# Patient Record
Sex: Male | Born: 1975 | Race: White | Hispanic: No | Marital: Single | Smoking: Current every day smoker
Health system: Southern US, Community
[De-identification: ages and names within clinical notes are randomized; demographics above are authoritative.]

## PROBLEM LIST (undated history)

## (undated) DIAGNOSIS — I10 Essential (primary) hypertension: Secondary | ICD-10-CM

## (undated) DIAGNOSIS — J45909 Unspecified asthma, uncomplicated: Secondary | ICD-10-CM

## (undated) DIAGNOSIS — I82409 Acute embolism and thrombosis of unspecified deep veins of unspecified lower extremity: Secondary | ICD-10-CM

## (undated) HISTORY — PX: NO PAST SURGERIES: SHX2092

---

## 2005-11-25 ENCOUNTER — Inpatient Hospital Stay: Payer: Self-pay | Admitting: Internal Medicine

## 2005-12-07 ENCOUNTER — Emergency Department: Payer: Self-pay | Admitting: Emergency Medicine

## 2005-12-08 ENCOUNTER — Emergency Department: Payer: Self-pay | Admitting: General Practice

## 2011-04-03 ENCOUNTER — Ambulatory Visit: Payer: Self-pay | Admitting: Family Medicine

## 2013-09-12 ENCOUNTER — Emergency Department: Payer: Self-pay | Admitting: Emergency Medicine

## 2014-03-05 ENCOUNTER — Emergency Department: Payer: Self-pay | Admitting: Emergency Medicine

## 2014-05-16 ENCOUNTER — Ambulatory Visit: Payer: Self-pay | Admitting: Physician Assistant

## 2014-07-28 ENCOUNTER — Ambulatory Visit: Payer: Self-pay | Admitting: Unknown Physician Specialty

## 2015-05-01 ENCOUNTER — Ambulatory Visit: Payer: Medicaid Other

## 2015-05-01 ENCOUNTER — Ambulatory Visit
Admission: EM | Admit: 2015-05-01 | Discharge: 2015-05-01 | Disposition: A | Discharge status: Home / Self Care | Payer: Medicaid Other | Attending: Family Medicine | Admitting: Family Medicine

## 2015-05-01 DIAGNOSIS — S9031XA Contusion of right foot, initial encounter: Secondary | ICD-10-CM

## 2015-05-01 DIAGNOSIS — T148XXA Other injury of unspecified body region, initial encounter: Secondary | ICD-10-CM

## 2015-05-01 DIAGNOSIS — T148 Other injury of unspecified body region: Secondary | ICD-10-CM

## 2015-05-01 DIAGNOSIS — W19XXXA Unspecified fall, initial encounter: Secondary | ICD-10-CM

## 2015-05-01 DIAGNOSIS — M25562 Pain in left knee: Secondary | ICD-10-CM

## 2015-05-01 HISTORY — DX: Essential (primary) hypertension: I10

## 2015-05-01 HISTORY — DX: Acute embolism and thrombosis of unspecified deep veins of unspecified lower extremity: I82.409

## 2015-05-01 HISTORY — DX: Unspecified asthma, uncomplicated: J45.909

## 2015-05-01 LAB — URINALYSIS COMPLETE WITH MICROSCOPIC (ARMC ONLY)
Bacteria, UA: NONE SEEN — AB
GLUCOSE, UA: NEGATIVE mg/dL
HGB URINE DIPSTICK: NEGATIVE
Ketones, ur: NEGATIVE mg/dL
LEUKOCYTES UA: NEGATIVE
NITRITE: NEGATIVE
PH: 5.5 (ref 5.0–8.0)
Protein, ur: NEGATIVE mg/dL
RBC / HPF: NONE SEEN RBC/hpf (ref <3)

## 2015-05-01 MED ORDER — OXYCODONE-ACETAMINOPHEN 5-325 MG PO TABS: 1.0000 | ORAL_TABLET | Freq: Three times a day (TID) | ORAL | Status: DC | PRN

## 2015-05-01 MED ORDER — IBUPROFEN 800 MG PO TABS: 800.0000 mg | ORAL_TABLET | Freq: Three times a day (TID) | ORAL | Status: DC | PRN

## 2015-05-01 NOTE — Discharge Instructions (Signed)
Take medication as prescribed. Apply ice and elevate. Rest. Use knee immobilizer and use crutches as long as pain continues.   Follow up with your primary care physician this week. Follow up with your orthopedic Dr Gavin Potters next week as needed for continued knee pain.   Return to Urgent care or go to ER for headache, dizziness, confusion, abdominal pain, chest pain, shortness of breath, weakness, neck pain, new or worsening concerns.   Contusion A contusion is a deep bruise. Contusions are the result of an injury that caused bleeding under the skin. The contusion may turn blue, purple, or yellow. Minor injuries will give you a painless contusion, but more severe contusions may stay painful and swollen for a few weeks.  CAUSES  A contusion is usually caused by a blow, trauma, or direct force to an area of the body. SYMPTOMS   Swelling and redness of the injured area.  Bruising of the injured area.  Tenderness and soreness of the injured area.  Pain. DIAGNOSIS  The diagnosis can be made by taking a history and physical exam. An X-ray, CT scan, or MRI may be needed to determine if there were any associated injuries, such as fractures. TREATMENT  Specific treatment will depend on what area of the body was injured. In general, the best treatment for a contusion is resting, icing, elevating, and applying cold compresses to the injured area. Over-the-counter medicines may also be recommended for pain control. Ask your caregiver what the best treatment is for your contusion. HOME CARE INSTRUCTIONS   Put ice on the injured area.  Put ice in a plastic bag.  Place a towel between your skin and the bag.  Leave the ice on for 15-20 minutes, 3-4 times a day, or as directed by your health care provider.  Only take over-the-counter or prescription medicines for pain, discomfort, or fever as directed by your caregiver. Your caregiver may recommend avoiding anti-inflammatory medicines (aspirin,  ibuprofen, and naproxen) for 48 hours because these medicines may increase bruising.  Rest the injured area.  If possible, elevate the injured area to reduce swelling. SEEK IMMEDIATE MEDICAL CARE IF:   You have increased bruising or swelling.  You have pain that is getting worse.  Your swelling or pain is not relieved with medicines. MAKE SURE YOU:   Understand these instructions.  Will watch your condition.  Will get help right away if you are not doing well or get worse. Document Released: 05/21/2005 Document Revised: 08/16/2013 Document Reviewed: 06/16/2011 [facility] Patient Information 2015 [place], [place]. This information is not intended to replace advice given to you by your health care provider. Make sure you discuss any questions you have with your health care provider.  Foot Contusion  A foot contusion is a deep bruise to the foot. Contusions happen when an injury causes bleeding under the skin. Signs of bruising include pain, puffiness (swelling), and discolored skin. The contusion may turn blue, purple, or yellow. HOME CARE  Put ice on the injured area.  Put ice in a plastic bag.  Place a towel between your skin and the bag.  Leave the ice on for 15-20 minutes, 03-04 times a day.  Only take medicines as told by your doctor.  Use an elastic wrap only as told. You may remove the wrap for sleeping, showering, and bathing. Take the wrap off if you lose feeling (numb) in your toes, or they turn blue or cold. Put the wrap on more loosely.  Keep the foot raised (  elevated) with pillows.  If your foot hurts, avoid standing or walking.  When your doctor says it is okay to use your foot, start using it slowly. If you have pain, lessen how much you use your foot.  See your doctor as told. GET HELP RIGHT AWAY IF:   You have more redness, puffiness, or pain in your foot.  Your puffiness or pain does not get better with medicine.  You lose feeling in your foot, or you  cannot move your toes.  Your foot turns cold or blue.  You have pain when you move your toes.  Your foot feels warm.  Your contusion does not get better in 2 days. MAKE SURE YOU:   Understand these instructions.  Will watch this condition.  Will get help right away if you or your child is not doing well or gets worse. Document Released: 05/20/2008 Document Revised: 02/10/2012 Document Reviewed: 07/15/2011 [facility] Patient Information 2015 [place], [place]. This information is not intended to replace advice given to you by your health care provider. Make sure you discuss any questions you have with your health care provider.  Knee Pain The knee is the complex joint between your thigh and your lower leg. It is made up of bones, tendons, ligaments, and cartilage. The bones that make up the knee are:  The femur in the thigh.  The tibia and fibula in the lower leg.  The patella or kneecap riding in the groove on the lower femur. CAUSES  Knee pain is a common complaint with many causes. A few of these causes are:  Injury, such as:  A ruptured ligament or tendon injury.  Torn cartilage.  Medical conditions, such as:  Gout  Arthritis  Infections  Overuse, over training, or overdoing a physical activity. Knee pain can be minor or severe. Knee pain can accompany debilitating injury. Minor knee problems often respond well to self-care measures or get well on their own. More serious injuries may need medical intervention or even surgery. SYMPTOMS The knee is complex. Symptoms of knee problems can vary widely. Some of the problems are:  Pain with movement and weight bearing.  Swelling and tenderness.  Buckling of the knee.  Inability to straighten or extend your knee.  Your knee locks and you cannot straighten it.  Warmth and redness with pain and fever.  Deformity or dislocation of the kneecap. DIAGNOSIS  Determining what is wrong may be very straight forward such as  when there is an injury. It can also be challenging because of the complexity of the knee. Tests to make a diagnosis may include:  Your caregiver taking a history and doing a physical exam.  Routine X-rays can be used to rule out other problems. X-rays will not reveal a cartilage tear. Some injuries of the knee can be diagnosed by:  Arthroscopy a surgical technique by which a small video camera is inserted through tiny incisions on the sides of the knee. This procedure is used to examine and repair internal knee joint problems. Tiny instruments can be used during arthroscopy to repair the torn knee cartilage (meniscus).  Arthrography is a radiology technique. A contrast liquid is directly injected into the knee joint. Internal structures of the knee joint then become visible on X-ray film.  An MRI scan is a non X-ray radiology procedure in which magnetic fields and a computer produce two- or three-dimensional images of the inside of the knee. Cartilage tears are often visible using an MRI scanner. MRI scans  have largely replaced arthrography in diagnosing cartilage tears of the knee.  Blood work.  Examination of the fluid that helps to lubricate the knee joint (synovial fluid). This is done by taking a sample out using a needle and a syringe. TREATMENT The treatment of knee problems depends on the cause. Some of these treatments are:  Depending on the injury, proper casting, splinting, surgery, or physical therapy care will be needed.  Give yourself adequate recovery time. Do not overuse your joints. If you begin to get sore during workout routines, back off. Slow down or do fewer repetitions.  For repetitive activities such as cycling or running, maintain your strength and nutrition.  Alternate muscle groups. For example, if you are a weight lifter, work the upper body on one day and the lower body the next.  Either tight or weak muscles do not give the proper support for your knee. Tight  or weak muscles do not absorb the stress placed on the knee joint. Keep the muscles surrounding the knee strong.  Take care of mechanical problems.  If you have flat feet, orthotics or special shoes may help. See your caregiver if you need help.  Arch supports, sometimes with wedges on the inner or outer aspect of the heel, can help. These can shift pressure away from the side of the knee most bothered by osteoarthritis.  A brace called an "unloader" brace also may be used to help ease the pressure on the most arthritic side of the knee.  If your caregiver has prescribed crutches, braces, wraps or ice, use as directed. The acronym for this is PRICE. This means protection, rest, ice, compression, and elevation.  Nonsteroidal anti-inflammatory drugs (NSAIDs), can help relieve pain. But if taken immediately after an injury, they may actually increase swelling. Take NSAIDs with food in your stomach. Stop them if you develop stomach problems. Do not take these if you have a history of ulcers, stomach pain, or bleeding from the bowel. Do not take without your caregiver's approval if you have problems with fluid retention, heart failure, or kidney problems.  For ongoing knee problems, physical therapy may be helpful.  Glucosamine and chondroitin are over-the-counter dietary supplements. Both may help relieve the pain of osteoarthritis in the knee. These medicines are different from the usual anti-inflammatory drugs. Glucosamine may decrease the rate of cartilage destruction.  Injections of a corticosteroid drug into your knee joint may help reduce the symptoms of an arthritis flare-up. They may provide pain relief that lasts a few months. You may have to wait a few months between injections. The injections do have a small increased risk of infection, water retention, and elevated blood sugar levels.  Hyaluronic acid injected into damaged joints may ease pain and provide lubrication. These injections may  work by reducing inflammation. A series of shots may give relief for as long as 6 months.  Topical painkillers. Applying certain ointments to your skin may help relieve the pain and stiffness of osteoarthritis. Ask your pharmacist for suggestions. Many over the-counter products are approved for temporary relief of arthritis pain.  In some countries, doctors often prescribe topical NSAIDs for relief of chronic conditions such as arthritis and tendinitis. A review of treatment with NSAID creams found that they worked as well as oral medications but without the serious side effects. PREVENTION  Maintain a healthy weight. Extra pounds put more strain on your joints.  Get strong, stay limber. Weak muscles are a common cause of knee injuries. Stretching is  important. Include flexibility exercises in your workouts.  Be smart about exercise. If you have osteoarthritis, chronic knee pain or recurring injuries, you may need to change the way you exercise. This does not mean you have to stop being active. If your knees ache after jogging or playing basketball, consider switching to swimming, water aerobics, or other low-impact activities, at least for a few days a week. Sometimes limiting high-impact activities will provide relief.  Make sure your shoes fit well. Choose footwear that is right for your sport.  Protect your knees. Use the proper gear for knee-sensitive activities. Use kneepads when playing volleyball or laying carpet. Buckle your seat belt every time you drive. Most shattered kneecaps occur in car accidents.  Rest when you are tired. SEEK MEDICAL CARE IF:  You have knee pain that is continual and does not seem to be getting better.  SEEK IMMEDIATE MEDICAL CARE IF:  Your knee joint feels hot to the touch and you have a high fever. MAKE SURE YOU:   Understand these instructions.  Will watch your condition.  Will get help right away if you are not doing well or get worse. Document  Released: 06/08/2007 Document Revised: 11/03/2011 Document Reviewed: 06/08/2007 [facility] Patient Information 2015 [place], [place]. This information is not intended to replace advice given to you by your health care provider. Make sure you discuss any questions you have with your health care provider.

## 2015-05-01 NOTE — ED Provider Notes (Signed)
[facility] Emergency Department Provider Note  ____________________________________________  Time seen: Approximately 1:43 PM  I have reviewed the triage vital signs and the nursing notes.   HISTORY  Chief Complaint Fall   HPI Logan Howard is a 39 y.o. male presents for complaints of bilateral lower leg pain. 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 States he was going up to the second floor by ladder and states just as he got to second floor his ladder shifted and moved. States he jumped off the ladder and held to the second floor landing with his arms. States when he jumped and held to the second floor he landed with chest and abdomen against landing and arms on top of landing. States when he landed this way it caused abrasions to abdomen, side and arms. States abrasions happened as he was sliding on the wood with arms and chest trying to hold on. States he was able to briefly hold on, but could not continue to hold and fell.   States when he fell he landed on his feet and then landed on his left knee. States he was wearing a hard hat helmet. States he did NOT hit head. Denies head injury or loss of consciousness. Denies neck or back injury. Denies neck or back pain. States did NOT injury or hit abdomen, chest or back from fall. States only hit chest and abdomen on landing while holding onto wooden landing. States height of top of landing was approximately 11 feet, but states he was able to lower self and control his fall some when he fell. States when he fell the distance from his fall was max approximately 8 feet (states distance from feet to ground was only a few feet).   Patient states injury was witnessed by coworkers, who also report he landed on feet and report no head injury or LOC. Reports he and coworkers agreed to fall and landing, and states fell to feet and then to both  knees. States he never fell all the way to the floor but states he was on his hands and knees. Denies hitting torso, head, neck or back on ground or other objects during fall.  States he has remained active throughout the day today including addressing his girlfriends dead car battery. States he has eaten and drank multiple times today. States urinating and moving bowels well since fall. Denies headache, dizziness, vision changes, weakness, neck or back pain, abdominal pain, rib pain, upper leg pain, chest pain, shortness of breath. States no pain above knees at rest or with movement. Denies blood in urine or stool.    Past Medical History  Diagnosis Date  . Hypertension   . Asthma   . DVT (deep venous thrombosis)    Primary osteoarthritis of right knee 11/16/2014  Complex tear of medial meniscus as current injury, left, subsequent encounter    PCP: Welton Flakes   There are no active problems to display for this patient.   History reviewed. No pertinent past surgical history.  Current Outpatient Rx  Name  Route  Sig  Dispense  Refill  . ibuprofen (ADVIL,MOTRIN) 800 MG tablet   Oral   Take 1 tablet (800 mg total) by mouth every 8 (eight) hours as needed for mild pain or moderate pain.   15 tablet   0   . oxyCODONE-acetaminophen (ROXICET) 5-325 MG per tablet   Oral   Take  1 tablet by mouth every 8 (eight) hours as needed for moderate pain or severe pain (Do not drive or operate heavy machinery while taking as can cause drowsiness.).   12 tablet   0     Allergies Review of patient's allergies indicates no known allergies.  History reviewed. No pertinent family history.  Social History Social History  Substance Use Topics  . Smoking status: Current Every Day Smoker -- 2.00 packs/day    Types: Cigarettes  . Smokeless tobacco: None  . Alcohol Use: Yes     Comment: 8 beers per day    Review of Systems Constitutional: No fever/chills Eyes: No visual changes. ENT: No sore  throat. Cardiovascular: Denies chest pain. Respiratory: Denies shortness of breath. Gastrointestinal: No abdominal pain.  No nausea, no vomiting.  No diarrhea.  No constipation. Genitourinary: Negative for dysuria. Musculoskeletal: Negative for neck or back pain.psoitive for right and left leg pain.  Skin: Negative for rash. Neurological: Negative for headaches, focal weakness or numbness.Denies for LOC.   10-point ROS otherwise negative.  ____________________________________________   PHYSICAL EXAM:  VITAL SIGNS: ED Triage Vitals  Enc Vitals Group     BP 05/01/15 1313 173/90 mmHg     Pulse Rate 05/01/15 1313 88     Resp 05/01/15 1313 18     Temp 05/01/15 1313 98.2 F (36.8 C)     Temp Source 05/01/15 1313 Tympanic     SpO2 05/01/15 1313 99 %     Weight 05/01/15 1313 320 lb (145.151 kg)     Height 05/01/15 1313  (1.93 m)     Head Cir --      Peak Flow --      Pain Score 05/01/15 1315 9     Pain Loc --      Pain Edu? --      Excl. in GC? --    Constitutional: Alert and oriented. Well appearing and in no acute distress. Eyes: Conjunctivae are normal. PERRL. EOMI.   Head: Atraumatic. No sinus TTP.Nontender. No tenderness to palpation.   Ears: no erythema, normal TMs bilaterally.   Nose: No congestion/rhinnorhea.  Mouth/Throat: Mucous membranes are moist.  Oropharynx non-erythematous. Neck: No stridor.  No cervical spine tenderness to palpation. Hematological/Lymphatic/Immunilogical: No cervical lymphadenopathy. Cardiovascular: Normal rate, regular rhythm. Grossly normal heart sounds.  Good peripheral circulation. Respiratory: Normal respiratory effort.  No retractions. Lungs CTAB. Gastrointestinal: Soft and nontender. No distention. Normal Bowel sounds.  No abdominal bruits. No CVA tenderness. Mid abdomen with superficial abrasions, no ecchymosis, Non tender. Musculoskeletal:  Bilateral pedal pulses equal and easily palpated. No cervical, thoracic or lumbar TTP.  Ribs  nontender. Right posterior upper back with superficial abrasion, no ecchymosis, nontender. Left lateral elbow with superficial abrasions, nontender, no swelling, full ROM.  Except: Right foot with ecchymosis along great toe with mild to mod TTP great toe and distal lateral foot, mild ecchymosis to anterior lower right tibia but nontender. Full ROM to right lower extremity. Right knee nontender. Left anterior proximal tibia/fibula area with mild to mod TTP, no swelling or ecchymosis, left dorsal foot mild TTP but no ecchymosis or swelling. Full ROM to left lower extremity. Steady gait. Bilateral pedal pulses equal and easily palpated. No calf tenderness bilaterally. Bilateral extremities nontender above knees.  Neurologic:  Normal speech and language. No gross focal neurologic deficits are appreciated. No gait instability. No ataxia, normal finger to nose. Negative Romberg. GCS 15.  5/5 strength to bilateral upper and lower extremities. CN 2-12  grossly intact.  Skin:  Skin is warm, dry and intact. No rash noted. Abrasions as above.  Psychiatric: Mood and affect are normal. Speech and behavior are normal. ____________________________________________   LABS (all labs ordered are listed, but only abnormal results are displayed)  Labs Reviewed  URINALYSIS COMPLETEWITH MICROSCOPIC ([facility] ONLY) - Abnormal; Notable for the following:    Bilirubin Urine 1+ (*)    Specific Gravity, Urine >1.030 (*)    Bacteria, UA NONE SEEN (*)    Squamous Epithelial / LPF 0-5 (*)    All other components within normal limits    RADIOLOGY   EXAM: LEFT TIBIA AND FIBULA - 2 VIEW  COMPARISON: None.  FINDINGS: No fracture is identified. Moderate appearing left knee joint effusion is seen. Small plantar calcaneal spurs are noted.  IMPRESSION: Negative for fracture.  Moderate left knee effusion.   Electronically Signed By: Drusilla Kanner M.D. On: 05/01/2015 14:34          DG Tibia/Fibula Right  (Final result) Result time: 05/01/15 14:34:19   Final result by Rad Results In Interface (05/01/15 14:34:19)   Narrative:   CLINICAL DATA: Fall from ladder with right foot and leg pain. Initial encounter.  EXAM: RIGHT TIBIA AND FIBULA - 2 VIEW  COMPARISON: None.  FINDINGS: Nonspecific subcutaneous reticulation about the lower right leg. No evidence for fracture or dislocation. No radiopaque foreign body.  IMPRESSION: Negative for fracture.   Electronically Signed By: Marnee Spring M.D. On: 05/01/2015 14:34          DG Foot Complete Left (Final result) Result time: 05/01/15 14:35:03   Final result by Rad Results In Interface (05/01/15 14:35:03)   Narrative:   CLINICAL DATA: Left foot pain after fall.  EXAM: LEFT FOOT - COMPLETE 3+ VIEW  COMPARISON: None.  FINDINGS: There is mild soft tissue swelling in the dorsal left midfoot. No fracture, dislocation or suspicious focal osseous lesion is seen in the left foot. The Lisfranc joint appears intact. Tiny Achilles and small to moderate plantar left calcaneal spurs. Joint spaces appear normal, with no appreciable degenerative or erosive arthropathy.  IMPRESSION: 1. No fracture or malalignment in the left foot. 2. Tiny Achilles and small to moderate plantar left calcaneal spurs.   Electronically Signed By: Delbert Phenix M.D. On: 05/01/2015 14:35          DG Foot Complete Right (Final result) Result time: 05/01/15 14:36:49   Final result by Rad Results In Interface (05/01/15 14:36:49)   Narrative:   CLINICAL DATA: Acute right foot pain after fall off ladder. Initial encounter.  EXAM: RIGHT FOOT COMPLETE - 3+ VIEW  COMPARISON: None.  FINDINGS: There is no evidence of fracture or dislocation. Severe degenerative changes seen involving the first metatarsophalangeal joint. Soft tissues are unremarkable.  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 No acute abnormality seen in the right foot.   Electronically Signed By: Lupita Raider, M.D. On: 05/01/2015 14:36   I, Renford Dills, personally viewed and evaluated these images (plain radiographs) as part of my medical decision making.   ____________________________________________   PROCEDURES  Procedure(s) performed:  Crutches given and left knee immobilizer placed by RN. Neurovascular intact post application.  _________________________________________   INITIAL IMPRESSION / ASSESSMENT AND PLAN / ED COURSE  Pertinent labs & imaging results that were available during my care of the patient were reviewed by me and considered in my medical decision making (see chart for details).  Discussed patient and plan of care  in detail with Dr Thurmond Butts who agrees with plan and plan of care.   Very well appearing patient. Ambulatory in room. Changes positions from lying to standing quickly without distress or difficulty. Reports felt this morning at approximately 0700. Reports spoke with multiple coworkers who witnessed event who also confirm with mechanism and fall. States he and coworkers report he fell to his feet and then to his left knee. States did not fall to side, did not hit abdomen or chest during fall, did not hit head or have LOC. Reports only pain is pain to bilateral lower legs and feet. Denies headache, dizziness, neck, back or abdominal pain. Denies chest pain, shortness of breath.Abdomen soft and nontender.  States no pain above bilateral knees. Remains ambulatory.   Left tibia and fibula xray negative for fracture, moderate left knee effusion, Left foot xray negative for fracture. Right foot xray negative for acute bony abnormality, sever degenerative joint disease of the first metatarsophalangeal joint. Right tibia fibula xray  Negative for fracture. Steady gait in room. No acute distress.   Rest. Ice. Crutches and left knee immobilizer. Left knee  with moderate left knee effusion and with reported history of left meniscus tear, discussed rest and follow up with his orthopedist Dr Gavin Potters. Discussed very strict follow up and return parameters with patient. Discussed the patient to follow up with his primary care physician this week. Discussed with patient to return to urgent care or proceed directly to the ER for worsening concerns including headache, dizziness, weakness, chest pain, shortness of breath, abdominal pain, increased pain or other concerns. Patient verbalized understanding and agreed to plan.  ____________________________________________   FINAL CLINICAL IMPRESSION(S) / ED DIAGNOSES  Final diagnoses:  Fall, initial encounter  Foot contusion, right, initial encounter  Left knee pain  Contusion       Renford Dills, NP 05/01/15 1943    Renford Dills, NP 05/01/15 2142

## 2015-05-01 NOTE — ED Notes (Signed)
States was 11-12 feet up a ladder this morning around 7am and the ladder fell. Hung onto ladder and "folded" when fell. Denies LOC. +brusing left upper abdomen, left elbow, left knee/leg. Right posterior thoracic bruising and states right great and 4th toes are bruised.

## 2016-06-02 ENCOUNTER — Emergency Department: Payer: Medicaid Other

## 2016-06-02 ENCOUNTER — Encounter: Payer: Self-pay | Admitting: Emergency Medicine

## 2016-06-02 ENCOUNTER — Emergency Department
Admission: EM | Admit: 2016-06-02 | Discharge: 2016-06-02 | Disposition: A | Discharge status: Home / Self Care | Payer: Medicaid Other | Attending: Emergency Medicine | Admitting: Emergency Medicine

## 2016-06-02 DIAGNOSIS — F17210 Nicotine dependence, cigarettes, uncomplicated: Secondary | ICD-10-CM | POA: Diagnosis not present

## 2016-06-02 DIAGNOSIS — J209 Acute bronchitis, unspecified: Secondary | ICD-10-CM | POA: Diagnosis not present

## 2016-06-02 DIAGNOSIS — I10 Essential (primary) hypertension: Secondary | ICD-10-CM | POA: Diagnosis not present

## 2016-06-02 DIAGNOSIS — R05 Cough: Secondary | ICD-10-CM | POA: Diagnosis present

## 2016-06-02 DIAGNOSIS — J45909 Unspecified asthma, uncomplicated: Secondary | ICD-10-CM | POA: Diagnosis not present

## 2016-06-02 MED ORDER — PROMETHAZINE-DM 6.25-15 MG/5ML PO SYRP: 5.0000 mL | ORAL_SOLUTION | Freq: Four times a day (QID) | ORAL | 0 refills | Status: DC | PRN

## 2016-06-02 MED ORDER — ALBUTEROL SULFATE HFA 108 (90 BASE) MCG/ACT IN AERS: 1.0000 | INHALATION_SPRAY | Freq: Four times a day (QID) | RESPIRATORY_TRACT | 0 refills | Status: DC | PRN

## 2016-06-02 MED ORDER — ALBUTEROL SULFATE (2.5 MG/3ML) 0.083% IN NEBU
2.5000 mg | INHALATION_SOLUTION | Freq: Once | RESPIRATORY_TRACT | Status: AC
  Administered 2016-06-02: 2.5 mg via RESPIRATORY_TRACT
  Filled 2016-06-02: qty 3

## 2016-06-02 NOTE — ED Notes (Signed)
See triage note  Developed sinus pressure and congestion last Friday  Also dry cough

## 2016-06-02 NOTE — ED Provider Notes (Signed)
[facility] Emergency Department Provider Note  ____________________________________________  Time seen: Approximately 9:26 AM  I have reviewed the triage vital signs and the nursing notes.   HISTORY  Chief Complaint Facial Pain and Cough    HPI Logan Howard is a 40 y.o. male , NAD, presents to the emergency department with 3 day history of sinus pressure, chest congestion and wheezing. Patient states illness began on Friday with sinus pressure and nasal congestion. Has worsened to include chest congestion, cough and wheezing. Has a history of asthma as a child but has not used any inhalers as an adult. Has had no shortness of breath or chest pain. Denies any abdominal pain, nausea or vomiting. Has not noted any rashes.Not had any sore throat, ear pain, headaches. Denies fever, chills, body aches. Has been taking multiple over-the-counter cough and cold remedies without any resolution of symptoms. States she's had a history of bronchitis and this feels similar. Also notes he has a nebulizer at home but has not used the medications as they expired in 2016.   Past Medical History:  Diagnosis Date  . Asthma   . DVT (deep venous thrombosis) (HCC)   . Hypertension     There are no active problems to display for this patient.   History reviewed. No pertinent surgical history.  Prior to Admission medications   Medication Sig Start Date End Date Taking? Authorizing Provider  albuterol (PROVENTIL HFA;VENTOLIN HFA) 108 (90 Base) MCG/ACT inhaler Inhale 1-2 puffs into the lungs every 6 (six) hours as needed for wheezing or shortness of breath. 06/02/16   Kaeleb Emond L Messi Twedt, PA-C  promethazine-dextromethorphan (PROMETHAZINE-DM) 6.25-15 MG/5ML syrup Take 5 mLs by mouth 4 (four) times daily as needed for cough. 06/02/16   Preesha Benjamin L Phinehas Grounds, PA-C    Allergies Review of patient's allergies indicates no known allergies.  No family history on file.  Social History Social  History  Substance Use Topics  . Smoking status: Current Every Day Smoker    Packs/day: 2.00    Types: Cigarettes  . Smokeless tobacco: Never Used  . Alcohol use Yes     Comment: 8 beers per day     Review of Systems  Constitutional: No fever/chills ENT: Positive nasal congestion, sinus pressure. No ear pain, sore throat. Cardiovascular: No chest pain. Respiratory: Positive cough, chest congestion, wheezing. No shortness of breath.  Gastrointestinal: No abdominal pain.  No nausea, vomiting.   Musculoskeletal: Negative for General myalgias.  Skin: Negative for rash. Neurological: Negative for headaches. 10-point ROS otherwise negative.  ____________________________________________   PHYSICAL EXAM:  VITAL SIGNS: ED Triage Vitals  Enc Vitals Group     BP 06/02/16 0823 (!) 179/113     Pulse Rate 06/02/16 0823 72     Resp 06/02/16 0823 18     Temp 06/02/16 0823 98.2 F (36.8 C)     Temp Source 06/02/16 0823 Oral     SpO2 06/02/16 0823 98 %     Weight 06/02/16 0820 (!) 320 lb (145.2 kg)     Height --      Head Circumference --      Peak Flow --      Pain Score --      Pain Loc --      Pain Edu? --      Excl. in GC? --      Constitutional: Alert and oriented. Well appearing and in no acute distress. Eyes: Conjunctivae are normal Without icterus or injection Head: Atraumatic.  ENT:      Ears: TMs visualized bilaterally without erythema, effusion, bulge, perforation.      Nose: No congestion/rhinnorhea.      Mouth/Throat: Mucous membranes are moist. Bilateral tonsils with severe swelling but no erythema or exudate. Airway is patent but mildly obstructed due to tonsillar swelling. No pharyngeal erythema or swelling.  Uvula is midline. Neck: No stridor. Supple with full range of motion. Trachea midline. Hematological/Lymphatic/Immunilogical: No cervical lymphadenopathy. Cardiovascular: Normal rate, regular rhythm. Normal S1 and S2.  Good peripheral  circulation. Respiratory: Normal respiratory effort without tachypnea or retractions. Moderate diffuse wheeze throughout bilateral lungs without rhonchi or rales.  Neurologic:  Normal speech and language. No gross focal neurologic deficits are appreciated.  Skin:  Skin is warm, dry and intact. No rash noted. Psychiatric: Mood and affect are normal. Speech and behavior are normal. Patient exhibits appropriate insight and judgement.   ____________________________________________   LABS  None ____________________________________________  EKG  None ____________________________________________  RADIOLOGY I, Hope Pigeon, personally viewed and evaluated these images (plain radiographs) as part of my medical decision making, as well as reviewing the written report by the radiologist.  Dg Chest 2 View  Result Date: 06/02/2016 CLINICAL DATA:  Sinus pressure and congestion since Friday. Dry cough. EXAM: CHEST  2 VIEW COMPARISON:  09/12/2013 FINDINGS: Mild peribronchial thickening. Heart and mediastinal contours are within normal limits. No focal opacities or effusions. No acute bony abnormality. IMPRESSION: Mild bronchitic changes. Electronically Signed   By: Charlett Nose M.D.   On: 06/02/2016 10:01    ____________________________________________    PROCEDURES  Procedure(s) performed: None   Procedures   Medications  albuterol (PROVENTIL) (2.5 MG/3ML) 0.083% nebulizer solution 2.5 mg (2.5 mg Nebulization Given 06/02/16 0953)   Respiratory exam after giving albuterol nebulized solution notes significant decrease in wheezes to trace and patient continues to have no rhonchi or rales.Patient verbalizes improvement of breathing.  ____________________________________________   INITIAL IMPRESSION / ASSESSMENT AND PLAN / ED COURSE  Pertinent labs & imaging results that were available during my care of the patient were reviewed by me and considered in my medical decision making (see  chart for details).  Clinical Course  Comment By Time  Patient notes that he previous had a primary care provider at [facility] but has not been there in over a year. Discussed with the patient that his tonsils were abnormally large and may be causing snoring and contributing to sleep apnea which when turning cause blood pressure to be elevated. Patient states he will attempt to gain an appointment with [facility] for follow-up but if he is unable to really understands he may call [facility] to schedule an appointment for follow-up. Hope Pigeon, PA-C 10/09 1025    Patient's diagnosis is consistent with acute bronchitis. Patient will be discharged home with prescriptions for albuterol inhaler and promethazine-DM cough syrup to take as directed. Patient states he had a primary care at [facility] but has not seen them in over a year. Discussed that he needs evaluation for sleep apnea due to enlarged tonsils and his report that he snores most evenings. If patient is unable to see his previous primary care provider in the medical he may follow up with [facility] for current illness as well as to establish care. Patient is given ED precautions to return to the ED for any worsening or new symptoms.    ____________________________________________  FINAL CLINICAL IMPRESSION(S) / ED DIAGNOSES  Final diagnoses:  Acute bronchitis, unspecified organism      NEW MEDICATIONS STARTED DURING THIS VISIT:  Discharge Medication List as of 06/02/2016 10:20 AM    START taking these medications   Details  albuterol (PROVENTIL HFA;VENTOLIN HFA) 108 (90 Base) MCG/ACT inhaler Inhale 1-2 puffs into the lungs every 6 (six) hours as needed for wheezing or shortness of breath., Starting Mon 06/02/2016, Print    promethazine-dextromethorphan (PROMETHAZINE-DM) 6.25-15 MG/5ML syrup Take 5 mLs by mouth 4 (four) times daily as needed for cough., Starting Mon 06/02/2016, Print              Ernestene Kiel Halsey, PA-C 06/02/16 1111    Jeanmarie Plant, MD 06/02/16 [phone]

## 2016-06-02 NOTE — ED Triage Notes (Signed)
Pt c/o cough and sinus congestion

## 2016-06-02 NOTE — Discharge Instructions (Signed)
Bronchitis is usually a viral illness and antibiotics are not warranted for viruses.   If you are not improving over the next 3-5 days, please see your primary care provider or [facility]   Advise you see your primary care physician to be evaluated for sleep apnea and may need to see ENT to evaluate large tonsils.

## 2018-01-07 ENCOUNTER — Ambulatory Visit
Admission: EM | Admit: 2018-01-07 | Discharge: 2018-01-07 | Disposition: A | Discharge status: Home / Self Care | Payer: Self-pay | Attending: Family Medicine | Admitting: Family Medicine

## 2018-01-07 DIAGNOSIS — J0100 Acute maxillary sinusitis, unspecified: Secondary | ICD-10-CM

## 2018-01-07 MED ORDER — AMOXICILLIN-POT CLAVULANATE 875-125 MG PO TABS: 1.0000 | ORAL_TABLET | Freq: Two times a day (BID) | ORAL | 0 refills | Status: DC

## 2018-01-07 NOTE — ED Provider Notes (Signed)
[facility]    CSN: [id number] Arrival date & time: 01/07/18  1423  History   Chief Complaint Chief Complaint  Patient presents with  . Facial Pain   HPI  42 year old male presents with concerns for sinusitis.  Patient states that he is been sick for the past week.  He has had severe sinus pressure and pain, particularly in the right maxillary region.  Associated congestion.  He reports discolored nasal discharge.  He states it is foul-smelling.  No fever.  He is taken Advil and Advil sinus without improvement.  No known exacerbating factors.  No other associated symptoms.  No other complaints.  Social History Social History   Tobacco Use  . Smoking status: Current Every Day Smoker    Packs/day: 2.00    Types: Cigarettes  . Smokeless tobacco: Never Used  Substance Use Topics  . Alcohol use: Yes    Comment: 8 beers per day  . Drug use: Not on file   Allergies   Patient has no known allergies.  Review of Systems Review of Systems  Constitutional: Negative for fever.  HENT: Positive for congestion, sinus pressure and sinus pain.    Physical Exam Triage Vital Signs ED Triage Vitals  Enc Vitals Group     BP 01/07/18 1433 (!) 152/92     Pulse Rate 01/07/18 1433 78     Resp 01/07/18 1433 18     Temp 01/07/18 1433 98.3 F (36.8 C)     Temp Source 01/07/18 1433 Oral     SpO2 01/07/18 1433 96 %     Weight 01/07/18 1435 (!) 310 lb (140.6 kg)     Height --      Head Circumference --      Peak Flow --      Pain Score 01/07/18 1435 0     Pain Loc --      Pain Edu? --      Excl. in GC? --    Updated Vital Signs BP (!) 152/92 (BP Location: Right Arm)   Pulse 78   Temp 98.3 F (36.8 C) (Oral)   Resp 18   Wt (!) 310 lb (140.6 kg)   SpO2 96%   BMI 37.73 kg/m   Physical Exam  Constitutional: He is oriented to person, place, and time. He appears well-developed. No distress.  HENT:  Head: Normocephalic and atraumatic.  3+ tonsils. No exudate. Poor  dentition. R maxillary sinus tenderness to palpation.   Cardiovascular: Normal rate and regular rhythm.  Pulmonary/Chest: Effort normal and breath sounds normal. He has no wheezes. He has no rales.  Neurological: He is alert and oriented to person, place, and time.  Psychiatric: He has a normal mood and affect. His behavior is normal.  Nursing note and vitals reviewed.   UC Treatments / Results  Labs (all labs ordered are listed, but only abnormal results are displayed) Labs Reviewed - No data to display  EKG None  Radiology No results found.  Procedures Procedures (including critical care time)  Medications Ordered in UC Medications - No data to display  Initial Impression / Assessment and Plan / UC Course  I have reviewed the triage vital signs and the nursing notes.  Pertinent labs & imaging results that were available during my care of the patient were reviewed by me and considered in my medical decision making (see chart for details).    42 year old male presents with sinusitis. Treating with Augmentin.  Final Clinical Impressions(s) /  UC Diagnoses   Final diagnoses:  Acute non-recurrent maxillary sinusitis   Discharge Instructions   None    ED Prescriptions    Medication Sig Dispense Auth. Provider   amoxicillin-clavulanate (AUGMENTIN) 875-125 MG tablet Take 1 tablet by mouth every 12 (twelve) hours. 14 tablet Tommie Sams, DO     Controlled Substance Prescriptions [place] Controlled Substance Registry consulted? Not Applicable   Tommie Sams, DO 01/07/18 1503

## 2018-01-07 NOTE — ED Triage Notes (Signed)
Pt here for possible sinus infection. Facial pressure behind his eyes, headache, nasal discharge with yellow mucus. Nasal congestion at night. Did take Advil sinus without relief.

## 2018-05-31 ENCOUNTER — Other Ambulatory Visit: Payer: Self-pay

## 2018-05-31 ENCOUNTER — Ambulatory Visit
Admission: EM | Admit: 2018-05-31 | Discharge: 2018-05-31 | Disposition: A | Discharge status: Home / Self Care | Payer: Self-pay | Attending: Family Medicine | Admitting: Family Medicine

## 2018-05-31 DIAGNOSIS — F17210 Nicotine dependence, cigarettes, uncomplicated: Secondary | ICD-10-CM

## 2018-05-31 DIAGNOSIS — J0100 Acute maxillary sinusitis, unspecified: Secondary | ICD-10-CM

## 2018-05-31 DIAGNOSIS — J40 Bronchitis, not specified as acute or chronic: Secondary | ICD-10-CM

## 2018-05-31 MED ORDER — ALBUTEROL SULFATE HFA 108 (90 BASE) MCG/ACT IN AERS: 2.0000 | INHALATION_SPRAY | RESPIRATORY_TRACT | 0 refills | Status: DC | PRN

## 2018-05-31 MED ORDER — PREDNISONE 10 MG PO TABS: ORAL_TABLET | ORAL | 0 refills | Status: DC

## 2018-05-31 MED ORDER — HYDROCOD POLST-CPM POLST ER 10-8 MG/5ML PO SUER: 5.0000 mL | Freq: Every evening | ORAL | 0 refills | Status: DC | PRN

## 2018-05-31 MED ORDER — DOXYCYCLINE HYCLATE 100 MG PO CAPS: 100.0000 mg | ORAL_CAPSULE | Freq: Two times a day (BID) | ORAL | 0 refills | Status: DC

## 2018-05-31 NOTE — ED Provider Notes (Signed)
[facility] ____________________________________________  Time seen: Approximately 09:20 AM  I have reviewed the triage vital signs and the nursing notes.   HISTORY  Chief Complaint Cough  HPI Logan Howard is a 42 y.o. male pain for evaluation of 1 week of nasal congestion, nasal drainage, intermittent sinus pressure and cough.  Patient reports he feels like it is starting to go into his chest over the last 2 days with increased coughing.  States cough is worse at night, particularly when he lays down and feels postnasal drainage.  States cough is overall a dry hacking nonproductive cough.  States getting a lot of thick greenish nasal drainage out.  Denies hemoptysis.  Has intermittently noticed some wheezing.  States no longer has albuterol inhaler at home.  Does have a history of asthma and recurrent bronchitis, chronic smoker, no known diagnosis of COPD.  Has felt warm intermittently, but denies fevers.  Trying multiple over-the-counter cough and decongestant agents.  Denies accompanying chest pain.  No current shortness of breath, but does report intermittent "winded" feelings when coughing back to back. States cough is disrupting his girlfriend's sleep.  No sore throat.  Continues to overall eat and drink well. Denies chest pain or extremity pain. Denies recent sickness, recent antibiotic use, recent immobilization.  Reports otherwise doing well.   Past Medical History:  Diagnosis Date  . Asthma   . DVT (deep venous thrombosis) (HCC)   . Hypertension     There are no active problems to display for this patient.   Past Surgical History:  Procedure Laterality Date  . NO PAST SURGERIES       No current facility-administered medications for this encounter.   Current Outpatient Medications:  .  albuterol (PROVENTIL HFA;VENTOLIN HFA) 108 (90 Base) MCG/ACT inhaler, Inhale 2 puffs into the lungs every 4 (four) hours as needed for wheezing., Disp: 1 Inhaler, Rfl:  0 .  chlorpheniramine-HYDROcodone (TUSSIONEX PENNKINETIC ER) 10-8 MG/5ML SUER, Take 5 mLs by mouth at bedtime as needed for cough. do not drive or operate machinery while taking as can cause drowsiness., Disp: 50 mL, Rfl: 0 .  doxycycline (VIBRAMYCIN) 100 MG capsule, Take 1 capsule (100 mg total) by mouth 2 (two) times daily., Disp: 20 capsule, Rfl: 0 .  predniSONE (DELTASONE) 10 MG tablet, Start 60 mg po day one, then 50 mg po day two, taper by 10 mg daily until complete., Disp: 21 tablet, Rfl: 0  Allergies Patient has no known allergies.  History reviewed. No pertinent family history.  Social History Social History   Tobacco Use  . Smoking status: Current Every Day Smoker    Packs/day: 2.00    Types: Cigarettes  . Smokeless tobacco: Never Used  Substance Use Topics  . Alcohol use: Yes    Comment: 8 beers per day  . Drug use: Not Currently    Review of Systems Constitutional: No known fever Eyes: No visual changes. ENT: No sore throat. Cardiovascular: Denies chest pain. Respiratory: Denies current shortness of breath.As above.  Gastrointestinal: No abdominal pain.  Musculoskeletal: Negative for back pain. Skin: Negative for rash.  ____________________________________________   PHYSICAL EXAM:  VITAL SIGNS: ED Triage Vitals  Enc Vitals Group     BP 05/31/18 0911 (!) 165/101     Pulse Rate 05/31/18 0911 74     Resp 05/31/18 0911 18     Temp 05/31/18 0911 98.2 F (36.8 C)     Temp Source 05/31/18 0911 Oral     SpO2 05/31/18  0911 96 %     Weight 05/31/18 0909 (!) 310 lb (140.6 kg)     Height 05/31/18 0909 6\' 4"  (1.93 m)     Head Circumference --      Peak Flow --      Pain Score 05/31/18 0909 0     Pain Loc --      Pain Edu? --      Excl. in GC? --    Constitutional: Alert and oriented. Well appearing and in no acute distress. Eyes: Conjunctivae are normal. PERRL. EOMI. Head: Atraumatic.Mild tenderness to palpation bilateral maxillary sinuses. Nontender frontal  sinuses. No swelling. No erythema.   Ears: no erythema, normal TMs bilaterally.   Nose: nasal congestion with bilateral nasal turbinate erythema and edema.   Mouth/Throat: Mucous membranes are moist.  Oropharynx non-erythematous.No tonsillar swelling or exudate.  Neck: No stridor.  No cervical spine tenderness to palpation. Hematological/Lymphatic/Immunilogical: No cervical lymphadenopathy. Cardiovascular: Normal rate, regular rhythm. Grossly normal heart sounds.  Good peripheral circulation. Respiratory: Normal respiratory effort.  No retractions.  Mild scattered inspiratory and expiratory wheezes.  No focal area of consolidation.  No rhonchi. Good air movement.  Speaks in complete sentences. Musculoskeletal: Steady gait.  Neurologic:  Normal speech and language. No gait instability. Skin:  Skin is warm, dry and intact. No rash noted. Psychiatric: Mood and affect are normal. Speech and behavior are normal.  ___________________________________________   LABS (all labs ordered are listed, but only abnormal results are displayed)  Labs Reviewed - No data to display  RADIOLOGY  No results found. ____________________________________________   PROCEDURES Procedures    INITIAL IMPRESSION / ASSESSMENT AND PLAN / ED COURSE  Pertinent labs & imaging results that were available during my care of the patient were reviewed by me and considered in my medical decision making (see chart for details).  Well-appearing patient.  No acute distress.  Suspect recent viral upper respiratory infection.  Suspect secondary sinusitis and bronchitis.  Discussed evaluation of chest x-ray, will defer at this time, patient agrees.  Discussed strict follow-up and return parameters if symptoms continue and reevaluation with likely chest x-ray at that time.  Will treat with doxycycline, prednisone taper, albuterol inhaler and PRN Tussionex.  Encourage rest, fluids, supportive care and counseled regarding smoking  cessation. Discussed indication, risks and benefits of medications with patient.  Discussed follow up with Primary care physician this week. Discussed follow up and return parameters including no resolution or any worsening concerns. Patient verbalized understanding and agreed to plan.   ____________________________________________   FINAL CLINICAL IMPRESSION(S) / ED DIAGNOSES  Final diagnoses:  Acute maxillary sinusitis, recurrence not specified  Bronchitis     ED Discharge Orders         Ordered    doxycycline (VIBRAMYCIN) 100 MG capsule  2 times daily     05/31/18 0925    predniSONE (DELTASONE) 10 MG tablet     05/31/18 0925    albuterol (PROVENTIL HFA;VENTOLIN HFA) 108 (90 Base) MCG/ACT inhaler  Every 4 hours PRN     05/31/18 0925    chlorpheniramine-HYDROcodone (TUSSIONEX PENNKINETIC ER) 10-8 MG/5ML SUER  At bedtime PRN     05/31/18 1610           Note: This dictation was prepared with Dragon dictation along with smaller phrase technology. Any transcriptional errors that result from this process are unintentional.         Renford Dills, NP 05/31/18 1040

## 2018-05-31 NOTE — Discharge Instructions (Addendum)
Take medication as prescribed. Rest. Drink plenty of fluids. Decreased smoking.   Follow up with your primary care physician this week as needed. Return to Urgent care for new or worsening concerns.

## 2018-05-31 NOTE — ED Triage Notes (Signed)
Patient complains of cough, congestion, sinus pain and pressure x 1 week. Patient states that cough is worse with activity and laying flat. Patient repots that he has tried and failed multiple OTC medications.

## 2018-11-17 ENCOUNTER — Encounter: Payer: Self-pay | Admitting: Emergency Medicine

## 2018-11-17 ENCOUNTER — Ambulatory Visit
Admission: EM | Admit: 2018-11-17 | Discharge: 2018-11-17 | Disposition: A | Discharge status: Home / Self Care | Payer: Self-pay | Attending: Family Medicine | Admitting: Family Medicine

## 2018-11-17 ENCOUNTER — Other Ambulatory Visit: Payer: Self-pay

## 2018-11-17 DIAGNOSIS — K047 Periapical abscess without sinus: Secondary | ICD-10-CM

## 2018-11-17 MED ORDER — PENICILLIN V POTASSIUM 500 MG PO TABS: 500.0000 mg | ORAL_TABLET | Freq: Four times a day (QID) | ORAL | 0 refills | Status: AC

## 2018-11-17 MED ORDER — CEFTRIAXONE SODIUM 1 G IJ SOLR
1.0000 g | Freq: Once | INTRAMUSCULAR | Status: AC
  Administered 2018-11-17: 1 g via INTRAMUSCULAR

## 2018-11-17 NOTE — ED Provider Notes (Signed)
[facility] ____________________________________________  Time seen: Approximately 8:52 AM  I have reviewed the triage vital signs and the nursing notes.   HISTORY  Chief Complaint Facial Swelling  HPI Logan Howard is a 43 y.o. male presenting for evaluation of right sided facial swelling with right upper dental tenderness.  Patient states he knows he has "bad teeth ", and states that he thinks that this infected.  Reports a few nights ago he was eating almonds, and he feels that this aggravated the right upper tooth area.  States yesterday morning he woke up with some swelling and tightness to his right upper gum, and states that that continued to increase throughout the day yesterday with associated pain.  States current pain is moderate.  Unresolved with Tylenol.  Continues to eat and drink well.  Denies fevers.  Occasional cough, but reports that is his baseline smoking cough, without change.  Denies chest pain or shortness of breath, abdominal pain. Reports otherwise doing well.   Past Medical History:  Diagnosis Date  . Asthma   . DVT (deep venous thrombosis) (HCC)   . Hypertension     There are no active problems to display for this patient.   Past Surgical History:  Procedure Laterality Date  . NO PAST SURGERIES      Current Outpatient Rx  . Order #: [id number] Class: Normal    Allergies Patient has no known allergies.  History reviewed. No pertinent family history.  Social History Social History   Tobacco Use  . Smoking status: Current Every Day Smoker    Packs/day: 2.00    Types: Cigarettes  . Smokeless tobacco: Never Used  Substance Use Topics  . Alcohol use: Yes    Comment: 8 beers per day  . Drug use: Not Currently    Review of Systems Constitutional: No fever/chills. Reports continues to eat and drink foods and fluids well.  Eyes: No visual changes. ENT: No sore throat. Cardiovascular: Denies chest pain. Respiratory: Denies  shortness of breath. Gastrointestinal: No abdominal pain.  No nausea, no vomiting.  Genitourinary: Negative for dysuria. Musculoskeletal: Negative for back pain. Skin: Negative for rash.   ____________________________________________   PHYSICAL EXAM:  VITAL SIGNS: ED Triage Vitals  Enc Vitals Group     BP 11/17/18 0825 (!) 158/101     Pulse Rate 11/17/18 0825 70     Resp 11/17/18 0825 18     Temp 11/17/18 0825 98.3 F (36.8 C)     Temp Source 11/17/18 0825 Oral     SpO2 11/17/18 0825 97 %     Weight 11/17/18 0823 300 lb (136.1 kg)     Height 11/17/18 0823 6\' 4"  (1.93 m)     Head Circumference --      Peak Flow --      Pain Score 11/17/18 0823 7     Pain Loc --      Pain Edu? --      Excl. in GC? --     Constitutional: Alert and oriented. Well appearing and in no acute distress. Head: Atraumatic. Ears: Bilateral ears no erythema, normal TMs.  Nose: No congestion/rhinnorhea. Mouth/Throat: Mucous membranes are moist.  Oropharynx non-erythematous. Periodontal Exam   Widespread dental decay with multiple caries and fractures.  Right upper gumline tenderness with erythema and mild edema, no visualized or palpated abscess.  Mild right lower facial swelling, no induration, no erythema. Neck: No stridor.  Hematological/Lymphatic/Immunilogical: No cervical lymphadenopathy. Cardiovascular:   Normal rate, regular rhythm.  Good peripheral circulation. Respiratory: Normal respiratory effort.  No retractions. Musculoskeletal: No lower or upper extremity tenderness nor edema. No midline cervical, thoracic or lumbar tenderness to palpation.  Neurologic:  Normal speech and language.  Skin:  Skin is warm, dry and intact. No rash noted. Psychiatric: Mood and affect are normal. Speech and behavior are normal.  ____________________________________________   LABS (all labs ordered are listed, but only abnormal results are displayed)  Labs Reviewed - No data to display  ____________________________________________   INITIAL IMPRESSION / ASSESSMENT AND PLAN / ED COURSE  Pertinent labs & imaging results that were available during my care of the patient were reviewed by me and considered in my medical decision making (see chart for details).  Well-appearing patient.  No acute distress.  Right dental infection.  No visible abscess.  1 g IM Rocephin given once in urgent care.  Will start on Pen-Vee K.  Alternate Tylenol ibuprofen, frequent mouth rinses.  Directed to follow-up with dentist as soon as possible.  Also further encourage patient to reestablish and follow-up with primary care.  Discussed her follow-up and return parameters.  Patient agreed to plan.   ____________________________________________   FINAL CLINICAL IMPRESSION(S) / ED DIAGNOSES  Final diagnoses:  Dental infection         Renford Dills, NP 11/17/18 [phone]

## 2018-11-17 NOTE — Discharge Instructions (Addendum)
Take medication as prescribed. Rest. Drink plenty of fluids. Soft foods. Rinse mouth frequently. Alternate tylenol and ibuprofen.   Follow up with dentist as soon as possible.   Follow up with your primary care physician. Return to Urgent care or Emergency room for new or worsening concerns.    OPTIONS FOR DENTAL FOLLOW UP CARE  [place] [facility] Health and Human Services - Local Safety Net Dental Clinics [email or web address]   [facility] [phone])  [facility] [phone])  [place] [phone])  [facility] [phone])  [facility] [phone]) This clinic caters to the indigent population and is on a lottery system. Location: [employer] of Dentistry, [employer], [address], [place] Clinic Hours: Wednesdays from 6pm - 9pm, patients seen by a lottery system. For dates, call or go to [email or web address] Services: Cleanings, fillings and simple extractions. Payment Options: DENTAL WORK IS FREE OF CHARGE. Bring proof of income or support. Best way to get seen: Arrive at 5:15 pm - this is a lottery, NOT first come/first serve, so arriving earlier will not increase your chances of being seen.     [facility] [phone] Select option 1 for emergencies   Location: [facility] of Dentistry, [place], [address] [place] Clinic Hours: No walk-ins accepted - call the day before to schedule an appointment. Check in times are 9:30 am and 1:30 pm. Services: Simple extractions, temporary fillings, pulpectomy/pulp debridement, uncomplicated abscess drainage. Payment Options: PAYMENT IS DUE AT THE TIME OF SERVICE.  Fee is usually $100-200, additional surgical procedures (e.g. abscess drainage) may be extra. Cash, checks, Visa/MasterCard accepted.  Can file Medicaid if patient is  covered for dental - patient should call case worker to check. No discount for [facility] patients. Best way to get seen: MUST call the day before and get onto the schedule. Can usually be seen the next 1-2 days. No walk-ins accepted.     [facility] [phone]   Location: [facility], [address] [place] Clinic Hours: M, W, Th, F 8am or 1:30pm, Tues 9a or 1:30 - first come/first served. Services: Simple extractions, temporary fillings, uncomplicated abscess drainage.  You do not need to be an [facility] resident. Payment Options: PAYMENT IS DUE AT THE TIME OF SERVICE. Dental insurance, otherwise sliding scale - bring proof of income or support. Depending on income and treatment needed, cost is usually $50-200. Best way to get seen: Arrive early as it is first come/first served.     [facility] [phone]   Location: [address] Clinic Hours: Mon-Thu 8a-5p Services: Most basic dental services including extractions and fillings. Payment Options: PAYMENT IS DUE AT THE TIME OF SERVICE. Sliding scale, up to 50% off - bring proof if income or support. Medicaid with dental option accepted. Best way to get seen: Call to schedule an appointment, can usually be seen within 2 weeks OR they will try to see walk-ins - show up at 8a or 2p (you may have to wait).     [facility] [phone] [place] RESIDENTS ONLY   Location: [facility], [address] [place], [place] [address] Clinic Hours: By appointment only. Monday - Thursday 8am-5pm, Friday 8am-12pm Services: Cleanings, fillings, extractions. Payment Options: PAYMENT IS DUE AT THE TIME OF SERVICE. Cash, Visa or MasterCard. Sliding scale - $30 minimum per service. Best way to get seen:  Come in to office, complete packet and make an appointment - need proof of income or support  monies for each household member and proof of [facility] residence. Usually takes about a month to get in.     [facility] [phone]   Location: [address] [facility] Clinic Hours: Walk-in Urgent Care Dental Services are offered Monday-Friday mornings only. The numbers of emergencies accepted daily is limited to the number of providers available. Maximum 15 - Mondays, Wednesdays & Thursdays Maximum 10 - Tuesdays & Fridays Services: You do not need to be a [facility] resident to be seen for a dental emergency. Emergencies are defined as pain, swelling, abnormal bleeding, or dental trauma. Walkins will receive x-rays if needed. NOTE: Dental cleaning is not an emergency. Payment Options: PAYMENT IS DUE AT THE TIME OF SERVICE. Minimum co-pay is $40.00 for uninsured patients. Minimum co-pay is $3.00 for Medicaid with dental coverage. Dental Insurance is accepted and must be presented at time of visit. Medicare does not cover dental. Forms of payment: Cash, credit card, checks. Best way to get seen: If not previously registered with the clinic, walk-in dental registration begins at 7:15 am and is on a first come/first serve basis. If previously registered with the clinic, call to make an appointment.     [facility] [phone] [place] RESIDENTS ONLY   Location: [address] [place], [place] Clinic Hours: Mon-Thu 10a-2p Services: Extractions only! Payment Options: FREE (donations accepted) - bring proof of income or support Best way to get seen: Call and schedule an appointment OR come at 8am on the 1st Monday of every month (except for holidays) when it is first come/first served.     [facility] [phone]   Location: [address] [place], [place] Clinic Hours: Friday mornings Services, Payment Options, Best way to get seen: Call for info

## 2018-11-17 NOTE — ED Triage Notes (Signed)
Patient c/o right side jaw swelling that started yesterday.

## 2020-02-09 ENCOUNTER — Encounter: Payer: Self-pay | Admitting: Emergency Medicine

## 2020-02-09 ENCOUNTER — Ambulatory Visit
Admission: EM | Admit: 2020-02-09 | Discharge: 2020-02-09 | Disposition: A | Discharge status: Home / Self Care | Payer: Medicaid Other | Attending: Emergency Medicine | Admitting: Emergency Medicine

## 2020-02-09 ENCOUNTER — Other Ambulatory Visit: Payer: Self-pay

## 2020-02-09 DIAGNOSIS — M5441 Lumbago with sciatica, right side: Secondary | ICD-10-CM | POA: Diagnosis not present

## 2020-02-09 MED ORDER — PREDNISONE 10 MG PO TABS: ORAL_TABLET | ORAL | 0 refills | Status: DC

## 2020-02-09 MED ORDER — OXYCODONE-ACETAMINOPHEN 5-325 MG PO TABS: 1.0000 | ORAL_TABLET | Freq: Four times a day (QID) | ORAL | 0 refills | Status: DC | PRN

## 2020-02-09 NOTE — ED Triage Notes (Signed)
Patient c/o low back pain that started this morning when he got up. Denies injury.

## 2020-02-09 NOTE — Discharge Instructions (Addendum)
Take medication as prescribed. Rest. Drink plenty of fluids.  Ice. Follow-up with your primary care or orthopedic as needed for continued pain.  Proceed directly to emergency room for any increased pain, numbness, difficulty walking or worsening concerns.

## 2020-02-09 NOTE — ED Provider Notes (Signed)
[facility] ____________________________________________  Time seen: Approximately 7:49 PM  I have reviewed the triage vital signs and the nursing notes.   HISTORY  Chief Complaint Back Pain  HPI Logan Howard is a 44 y.o. male presenting for evaluation of low back pain.  Patient reports he does a lot of bending every day while at work.  Reports he has been working a lot recently.  States this morning he got up and had some soreness in his low back, sat back down on bed and bent forward and felt increasing pain to his low back.  States pain has been catching since.  Pain intermittently radiates down right leg.  States if he sits completely still his pain resolves.  States pain is predominant with movement and activity.  Denies urinary bowel retention or incontinence.  Denies abdominal pain.  Denies fevers, cough, recent sickness.  Unresolved with over-the-counter NSAIDs.  History of similar with previous back pain.  Denies fall or direct injury recently.  Reports otherwise doing well.   Past Medical History:  Diagnosis Date  . Asthma   . DVT (deep venous thrombosis) ([place])   . Hypertension     There are no problems to display for this patient.   Past Surgical History:  Procedure Laterality Date  . NO PAST SURGERIES       No current facility-administered medications for this encounter.  Current Outpatient Medications:  .  oxyCODONE-acetaminophen (PERCOCET/ROXICET) 5-325 MG tablet, Take 1 tablet by mouth every 6 (six) hours as needed for severe pain. Do not drive while taking as can cause drowsiness., Disp: 8 tablet, Rfl: 0 .  predniSONE (DELTASONE) 10 MG tablet, Take 60mg  orally day one, then 55 mg orally day two, then 50 mg orally day three, then taper by 5 mg daily over 12 days until complete., Disp: 35 tablet, Rfl: 0  Allergies Patient has no known allergies.  History reviewed. No pertinent family history.  Social History Social History   Tobacco  Use  . Smoking status: Current Every Day Smoker    Packs/day: 2.00    Types: Cigarettes  . Smokeless tobacco: Never Used  Vaping Use  . Vaping Use: Never used  Substance Use Topics  . Alcohol use: Yes    Comment: 8 beers per day  . Drug use: Not Currently    Review of Systems Constitutional: No fever Cardiovascular: Denies chest pain. Respiratory: Denies shortness of breath. Gastrointestinal: No abdominal pain.  No nausea, no vomiting.  No diarrhea.  No constipation. Genitourinary: Negative for dysuria. Musculoskeletal: Positive for back pain. Skin: Negative for rash. Neurological: Negative for headaches, focal weakness or numbness.   ____________________________________________   PHYSICAL EXAM:  VITAL SIGNS: ED Triage Vitals  Enc Vitals Group     BP 02/09/20 1826 (!) 152/93     Pulse Rate 02/09/20 1826 94     Resp 02/09/20 1826 18     Temp 02/09/20 1826 98 F (36.7 C)     Temp Source 02/09/20 1826 Oral     SpO2 02/09/20 1826 97 %     Weight 02/09/20 1825 (!) 310 lb (140.6 kg)     Height 02/09/20 1825 6\' 4"  (1.93 m)     Head Circumference --      Peak Flow --      Pain Score 02/09/20 1825 5     Pain Loc --      Pain Edu? --      Excl. in [place]? --  Constitutional: Alert and oriented. Well appearing and in no acute distress. Eyes: Conjunctivae are normal.  ENT      Head: Normocephalic and atraumatic. Cardiovascular: Normal rate, regular rhythm. Grossly normal heart sounds.  Good peripheral circulation. Respiratory: Normal respiratory effort without tachypnea nor retractions. Breath sounds are clear and equal bilaterally. No wheezes, rales, rhonchi. Gastrointestinal:  No CVA tenderness. Musculoskeletal: Steady gait.  No midline cervical or thoracic tenderness palpation.  Mild midline lumbar and bilateral paralumbar tenderness palpation with tenderness along right greater sciatic notch, no rash, no erythema, no ecchymosis, no saddle anesthesia, changes positions  quickly, mild pain with right and left lumbar rotation, mild pain with lumbar flexion and extension, able to bend forward and touch the floor.  No pain with standing bilateral knee lifts.  Bilateral plantar flexion dorsiflexion strong and equal.  Neurologic:  Normal speech and language. No gross focal neurologic deficits are appreciated. Speech is normal. No gait instability.  Skin:  Skin is warm, dry and intact. No rash noted. Psychiatric: Mood and affect are normal. Speech and behavior are normal. Patient exhibits appropriate insight and judgment   ___________________________________________   LABS (all labs ordered are listed, but only abnormal results are displayed)  Labs Reviewed - No data to display ____________________________________________   PROCEDURES Procedures   INITIAL IMPRESSION / ASSESSMENT AND PLAN / ED COURSE  Pertinent labs & imaging results that were available during my care of the patient were reviewed by me and considered in my medical decision making (see chart for details).  Presenting for evaluation of low back pain.  Denies recent injury.  Right lower back sciatic.  Will treat with prednisone and Percocet as needed for breakthrough pain.  [place] controlled substance database reviewed, no recent controlled substances documented.  Ice, stretching, supportive care.  Discussed follow-up and return parameters.Discussed indication, risks and benefits of medications with patient.   Discussed follow up with Primary care physician this week. Discussed follow up and return parameters including no resolution or any worsening concerns. Patient verbalized understanding and agreed to plan.   ____________________________________________   FINAL CLINICAL IMPRESSION(S) / ED DIAGNOSES  Final diagnoses:  Acute right-sided low back pain with right-sided sciatica     ED Discharge Orders         Ordered    predniSONE (DELTASONE) 10 MG tablet     Discontinue  Reprint      02/09/20 1950    oxyCODONE-acetaminophen (PERCOCET/ROXICET) 5-325 MG tablet  Every 6 hours PRN     Discontinue  Reprint     02/09/20 1950           Note: This dictation was prepared with Dragon dictation along with smaller phrase technology. Any transcriptional errors that result from this process are unintentional.         Renford Dills, NP 02/09/20 2036

## 2020-04-25 ENCOUNTER — Ambulatory Visit (INDEPENDENT_AMBULATORY_CARE_PROVIDER_SITE_OTHER): Payer: Medicaid Other

## 2020-04-25 ENCOUNTER — Other Ambulatory Visit: Payer: Self-pay

## 2020-04-25 ENCOUNTER — Ambulatory Visit
Admission: EM | Admit: 2020-04-25 | Discharge: 2020-04-25 | Disposition: A | Discharge status: Home / Self Care | Payer: Medicaid Other | Attending: Emergency Medicine | Admitting: Emergency Medicine

## 2020-04-25 DIAGNOSIS — Z20822 Contact with and (suspected) exposure to covid-19: Secondary | ICD-10-CM | POA: Insufficient documentation

## 2020-04-25 DIAGNOSIS — J069 Acute upper respiratory infection, unspecified: Secondary | ICD-10-CM | POA: Diagnosis present

## 2020-04-25 DIAGNOSIS — R0602 Shortness of breath: Secondary | ICD-10-CM

## 2020-04-25 DIAGNOSIS — R05 Cough: Secondary | ICD-10-CM

## 2020-04-25 DIAGNOSIS — J019 Acute sinusitis, unspecified: Secondary | ICD-10-CM | POA: Diagnosis not present

## 2020-04-25 LAB — SARS CORONAVIRUS 2 (TAT 6-24 HRS): SARS Coronavirus 2: NEGATIVE

## 2020-04-25 MED ORDER — ALBUTEROL SULFATE (2.5 MG/3ML) 0.083% IN NEBU: 2.5000 mg | INHALATION_SOLUTION | RESPIRATORY_TRACT | 0 refills | Status: DC | PRN

## 2020-04-25 MED ORDER — ALBUTEROL SULFATE HFA 108 (90 BASE) MCG/ACT IN AERS: 1.0000 | INHALATION_SPRAY | Freq: Four times a day (QID) | RESPIRATORY_TRACT | 0 refills | Status: DC | PRN

## 2020-04-25 MED ORDER — FLUTICASONE PROPIONATE 50 MCG/ACT NA SUSP: 2.0000 | Freq: Every day | NASAL | 0 refills | Status: DC

## 2020-04-25 MED ORDER — AMOXICILLIN-POT CLAVULANATE 875-125 MG PO TABS: 1.0000 | ORAL_TABLET | Freq: Two times a day (BID) | ORAL | 0 refills | Status: DC

## 2020-04-25 MED ORDER — AEROCHAMBER PLUS MISC: 2 refills | Status: DC

## 2020-04-25 MED ORDER — PREDNISONE 10 MG (21) PO TBPK: ORAL_TABLET | ORAL | 0 refills | Status: DC

## 2020-04-25 NOTE — ED Triage Notes (Signed)
Patient in today w/ c/o cough, SOB, sore throat, sinus and chest congestion. Patient states sx onset was 2-3 days ago. Patient denies loss of taste and smell, fever, N/V/D.   Patient received 2nd dose of Moderna vaccine approx. March or April 2021.

## 2020-04-25 NOTE — Discharge Instructions (Addendum)
Your chest x-ray was negative for pneumonia.   2 puffs every 4 hours for 2 days, then every 6 hours for 2 days, then as needed.  Or may use nebulizer machine.  6-day prednisone taper, continue Mucinex.  Start Flonase, saline nasal irrigation NeilMed rinse and distilled water as often as you want.  Start using pulse oximeter.  Wait-and-see prescription of Augmentin-wait 5 more days.  Only fill it if your Covid test is negative and you have been sick for 10 days total.

## 2020-04-25 NOTE — ED Provider Notes (Signed)
HPI  SUBJECTIVE:  Logan Howard is a 44 y.o. male who presents with 5 days of sinus/chest congestion. Reports nasal congestion, yellow rhinorrhea, sinus pain and pressure, cough productive of clear yellow phlegm. He reports shortness of breath with exertion, sinus headache. He states that things "taste funny". Some mild diarrhea. No fevers, body aches, loss of sense of smell. No facial swelling, upper dental pain. No sore throat, postnasal drip. No nausea, vomiting. No known Covid exposure. He got both doses of the Covid vaccine in April/May. No antibiotics in the past month. He took DayQuil within 6 hours of evaluation. He has tried Mucinex with improvement in his symptoms. He also states that standing up helps. He has tried DayQuil, Tylenol Cold and sinus TheraFlu cold and sinus ibuprofen 600 mg twice. Symptoms are worse with lying down. He has a past medical history of asthma, currently smokes. He does not have any albuterol at home, but states that he has access to a nebulizer. 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 He is currently not on any anticoagulation. No history of PE, diabetes, coronary disease, HIV, cancer, immunocompromise. He has a history of hypertension. PMD: None.    Past Medical History:  Diagnosis Date  . Asthma   . DVT (deep venous thrombosis) ([facility])   . Hypertension     Past Surgical History:  Procedure Laterality Date  . NO PAST SURGERIES      History reviewed. No pertinent family history.  Social History   Tobacco Use  . Smoking status: Current Every Day Smoker    Packs/day: 2.00    Types: Cigarettes  . Smokeless tobacco: Never Used  Vaping Use  . Vaping Use: Never used  Substance Use Topics  . Alcohol use: Yes    Comment: 8 beers per day  . Drug use: Not Currently    No current facility-administered medications for this encounter.  Current Outpatient Medications:  .  albuterol (PROVENTIL) (2.5 MG/3ML)  0.083% nebulizer solution, Take 3 mLs (2.5 mg total) by nebulization every 4 (four) hours as needed for wheezing or shortness of breath., Disp: 75 mL, Rfl: 0 .  albuterol (VENTOLIN HFA) 108 (90 Base) MCG/ACT inhaler, Inhale 1-2 puffs into the lungs every 6 (six) hours as needed for wheezing or shortness of breath., Disp: 1 each, Rfl: 0 .  amoxicillin-clavulanate (AUGMENTIN) 875-125 MG tablet, Take 1 tablet by mouth 2 (two) times daily. X 7 days, Disp: 14 tablet, Rfl: 0 .  fluticasone (FLONASE) 50 MCG/ACT nasal spray, Place 2 sprays into both nostrils daily., Disp: 16 g, Rfl: 0 .  predniSONE (STERAPRED UNI-PAK 21 TAB) 10 MG (21) TBPK tablet, Dispense one 6 day pack. Take as directed with food., Disp: 21 tablet, Rfl: 0 .  Spacer/Aero-Holding Chambers (AEROCHAMBER PLUS) inhaler, Use as instructed, Disp: 1 each, Rfl: 2  No Known Allergies   ROS  As noted in HPI.   Physical Exam  BP (!) 149/93 (BP Location: Right Arm)   Pulse 78   Temp 98.4 F (36.9 C) (Oral)   Resp 16   Ht 6\' 4"  (1.93 m)   Wt (!) 154.2 kg   SpO2 97%   BMI 41.39 kg/m   Constitutional: Well developed, well nourished, no acute distress Eyes: PERRL, EOMI, conjunctiva normal bilaterally HENT: Normocephalic, atraumatic,mucus membranes moist.  Positive purulent nasal congestion.  No maxillary, frontal sinus tenderness.  Enlarged erythematous tonsils without exudates.  Patient states that he has large tonsils.  Uvula midline.  No apparent postnasal drip. Respiratory: Fair air movement, wheezing throughout all lung fields Cardiovascular: Normal rate and rhythm, no murmurs, no gallops, no rubs GI: Nondistended skin: No rash, skin intact Musculoskeletal: No deformities Neurologic: Alert & oriented x 3, CN III-XII grossly intact, no motor deficits, sensation grossly intact Psychiatric: Speech and behavior appropriate   ED Course   Medications - No data to display  Orders Placed This Encounter  Procedures  . SARS  CORONAVIRUS 2 (TAT 6-24 HRS) Nasopharyngeal Nasopharyngeal Swab    Moderna 2nd dose received 11/2019    Standing Status:   Standing    Number of Occurrences:   1    Order Specific Question:   Is this test for diagnosis or screening    Answer:   Screening    Order Specific Question:   Symptomatic for COVID-19 as defined by CDC    Answer:   No    Order Specific Question:   Hospitalized for COVID-19    Answer:   No    Order Specific Question:   Admitted to ICU for COVID-19    Answer:   No    Order Specific Question:   Previously tested for COVID-19    Answer:   No    Order Specific Question:   Resident in a congregate (group) care setting    Answer:   No    Order Specific Question:   Employed in healthcare setting    Answer:   No    Order Specific Question:   Has patient completed COVID vaccination(s) (2 doses of Pfizer/Moderna 1 dose of [employer])    Answer:   Yes  . DG Chest 2 View    Standing Status:   Standing    Number of Occurrences:   1    Order Specific Question:   Reason for Exam (SYMPTOM  OR DIAGNOSIS REQUIRED)    Answer:   r/o PNA   No results found for this or any previous visit (from the past 24 hour(s)). DG Chest 2 View  Result Date: 04/25/2020 CLINICAL DATA:  Rule out pneumonia.  Cough and shortness of breath. EXAM: CHEST - 2 VIEW COMPARISON:  06/02/2016 FINDINGS: Borderline bronchitic changes on the lateral view. Prominent lung volumes. There is no edema, consolidation, effusion, or pneumothorax. Normal heart size and mediastinal contours. IMPRESSION: Large lung volumes and borderline bronchitic markings. No focal pneumonia. Electronically Signed   By: Marnee Spring M.D.   On: 04/25/2020 10:14    ED Clinical Impression  1. Viral URI with cough   2. Acute non-recurrent sinusitis, unspecified location   3. Encounter for laboratory testing for COVID-19 virus      ED Assessment/Plan  Patient with viral respiratory illness, concern for Covid.  Will get chest  x-ray due to diffuse wheezing.  Suspect that he has a secondary sinus infection.  He will be a candidate for a monoclonal antibody infusion due to BMI and asthma.  Reviewed imaging independently.  No focal pneumonia.  Large lung volumes, borderline bronchitic markings. See radiology report for full details.  Plan to send home with an albuterol inhaler with a spacer.  2 puffs every 4 hours for 2 days, then every 6 hours for 2 days, then as needed.  We will also provide a prescription for albuterol nebs.  He states he has access to a machine at home.  Prednisone taper for 6 days, continue Mucinex.  Start Flonase, saline nasal irrigation.  Start using pulse oximeter.  Wait-and-see prescription of Augmentin.  Discussed  imaging, MDM, treatment plan, and plan for follow-up with patient Discussed sn/sx that should prompt return to the ED. patient agrees with plan.   Meds ordered this encounter  Medications  . albuterol (VENTOLIN HFA) 108 (90 Base) MCG/ACT inhaler    Sig: Inhale 1-2 puffs into the lungs every 6 (six) hours as needed for wheezing or shortness of breath.    Dispense:  1 each    Refill:  0  . Spacer/Aero-Holding Chambers (AEROCHAMBER PLUS) inhaler    Sig: Use as instructed    Dispense:  1 each    Refill:  2  . fluticasone (FLONASE) 50 MCG/ACT nasal spray    Sig: Place 2 sprays into both nostrils daily.    Dispense:  16 g    Refill:  0  . predniSONE (STERAPRED UNI-PAK 21 TAB) 10 MG (21) TBPK tablet    Sig: Dispense one 6 day pack. Take as directed with food.    Dispense:  21 tablet    Refill:  0  . amoxicillin-clavulanate (AUGMENTIN) 875-125 MG tablet    Sig: Take 1 tablet by mouth 2 (two) times daily. X 7 days    Dispense:  14 tablet    Refill:  0  . albuterol (PROVENTIL) (2.5 MG/3ML) 0.083% nebulizer solution    Sig: Take 3 mLs (2.5 mg total) by nebulization every 4 (four) hours as needed for wheezing or shortness of breath.    Dispense:  75 mL    Refill:  0    *This  clinic note was created using Scientist, clinical (histocompatibility and immunogenetics). Therefore, there may be occasional mistakes despite careful proofreading.  ?    Domenick Gong, MD 04/25/20 1101

## 2020-10-07 IMAGING — CR DG CHEST 2V
4 series · 4 of 4 positions shown · non-contrast
Comparison: 06/02/2016

CLINICAL DATA: Rule out pneumonia.  Cough and shortness of breath.

EXAM:
CHEST - 2 VIEW

[chest lat (1 of 2)]
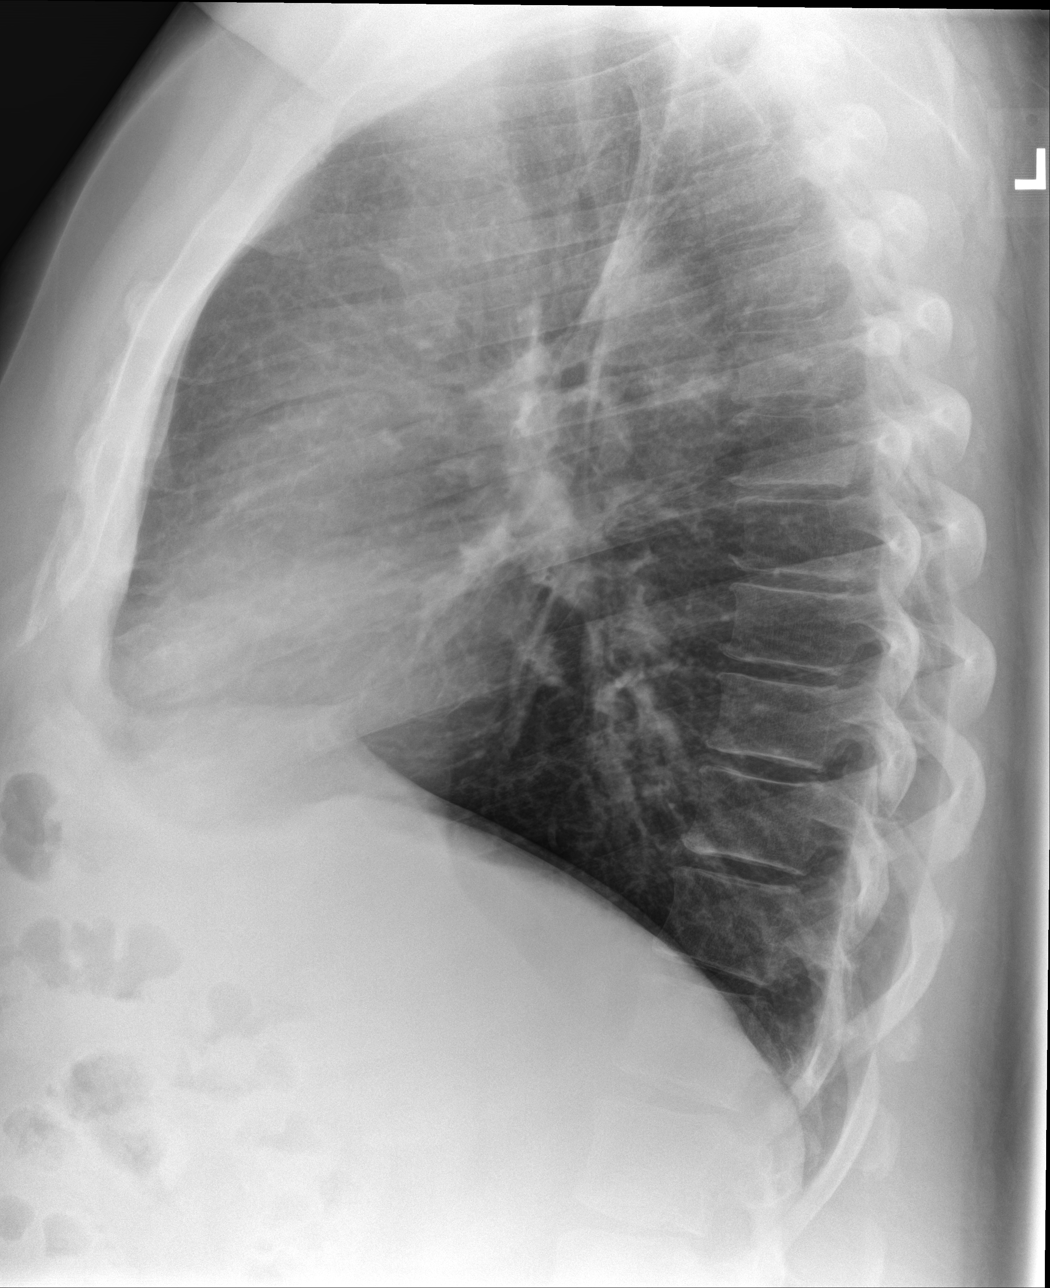

[chest pa (1 of 2)]
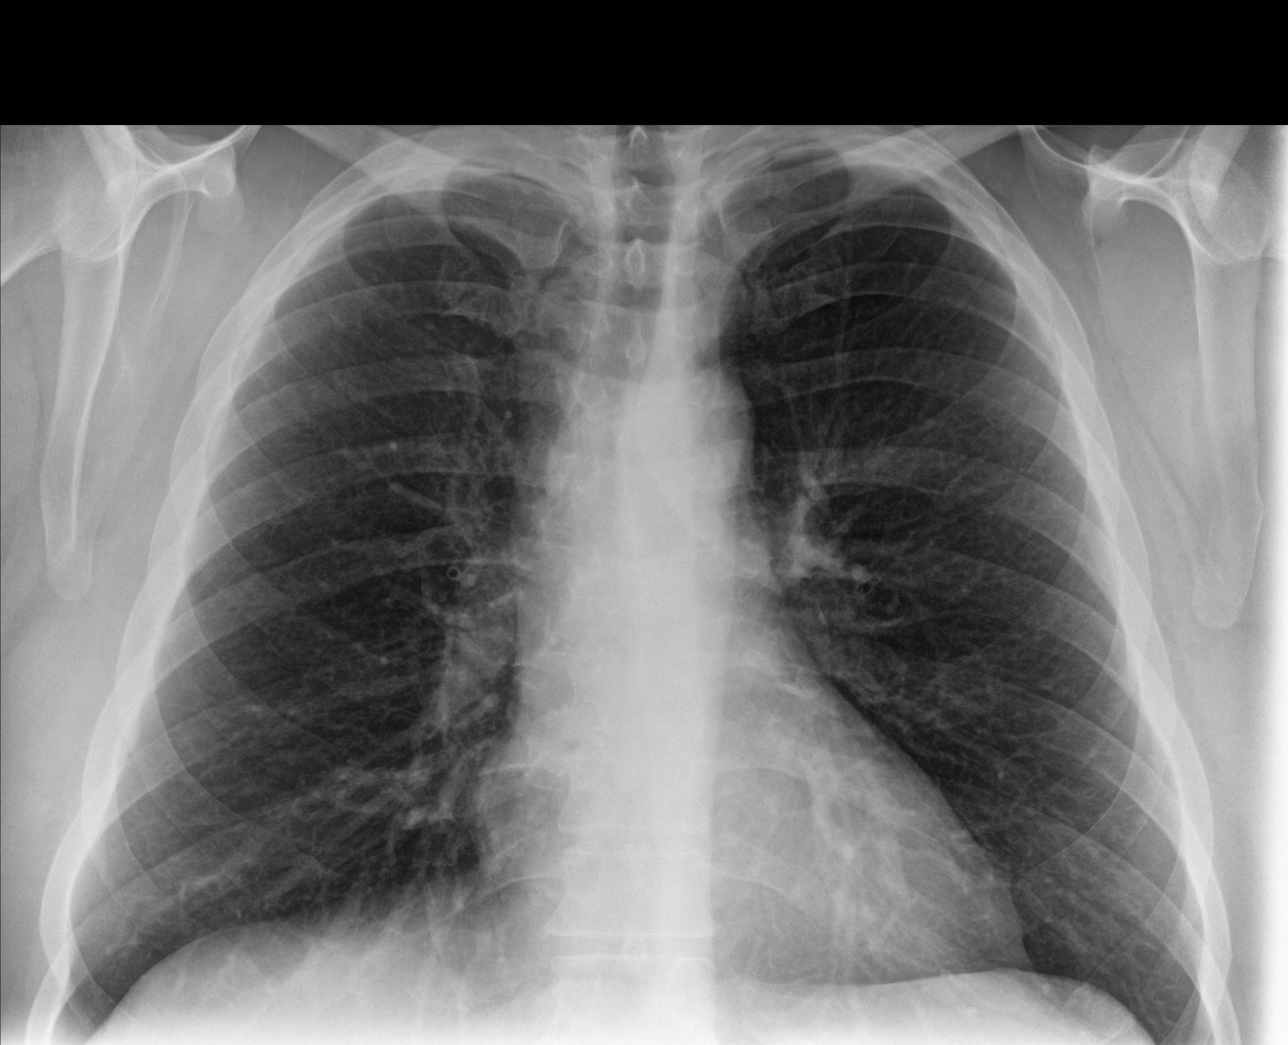

[chest pa (2 of 2)]
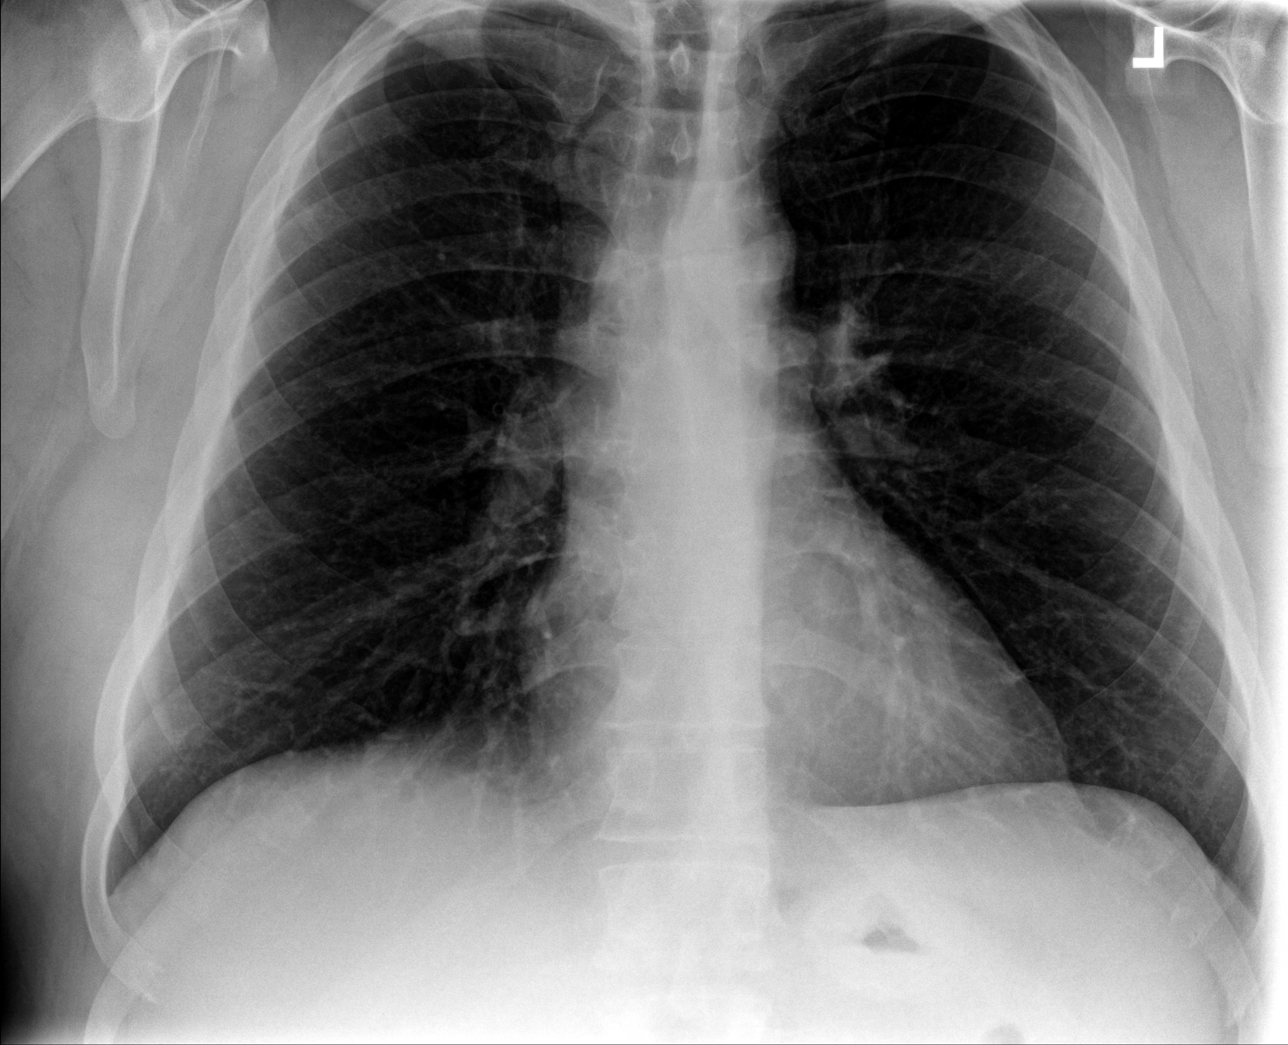

[chest lat (2 of 2)]
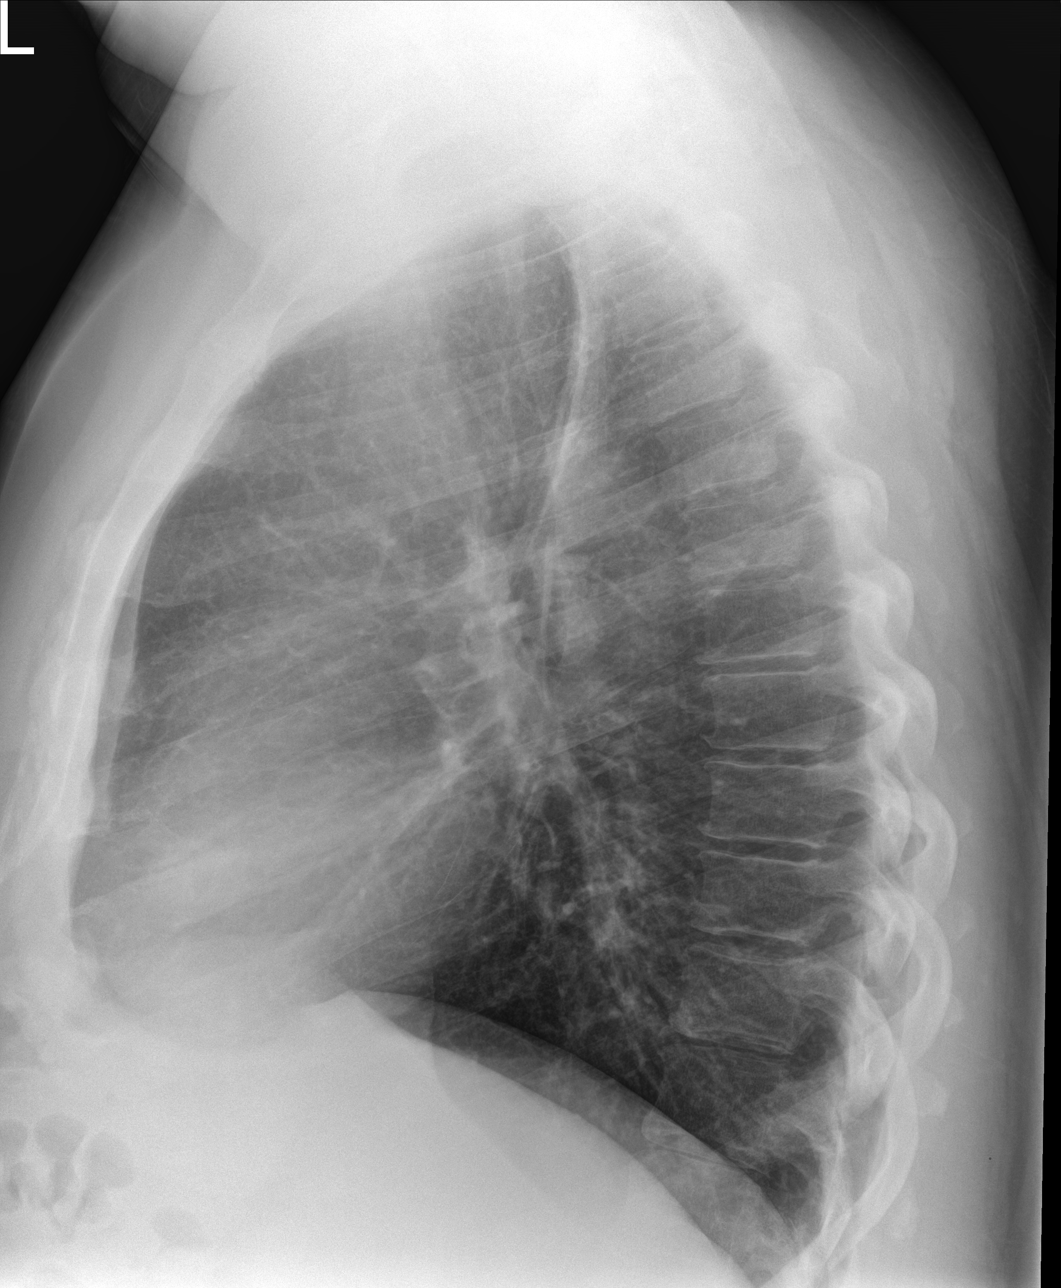

[4 of 4 positions shown; findings below may reference images not displayed]

FINDINGS: Borderline bronchitic changes on the lateral view. Prominent lung
volumes. There is no edema, consolidation, effusion, or
pneumothorax. Normal heart size and mediastinal contours.
IMPRESSION: Large lung volumes and borderline bronchitic markings. No focal
pneumonia.

## 2021-07-04 ENCOUNTER — Other Ambulatory Visit: Payer: Self-pay

## 2021-07-04 ENCOUNTER — Ambulatory Visit
Admission: EM | Admit: 2021-07-04 | Discharge: 2021-07-04 | Disposition: A | Discharge status: Home / Self Care | Payer: Medicaid Other | Attending: Emergency Medicine | Admitting: Emergency Medicine

## 2021-07-04 DIAGNOSIS — J111 Influenza due to unidentified influenza virus with other respiratory manifestations: Secondary | ICD-10-CM

## 2021-07-04 MED ORDER — OSELTAMIVIR PHOSPHATE 75 MG PO CAPS: 75.0000 mg | ORAL_CAPSULE | Freq: Two times a day (BID) | ORAL | 0 refills | Status: DC

## 2021-07-04 NOTE — ED Triage Notes (Signed)
Pt here with C/O body aches, sore throat, headache since yesterday. Daughter was DX with flu on Monday.

## 2021-07-04 NOTE — ED Provider Notes (Signed)
[facility]    CSN: [id number] Arrival date & time: 07/04/21  1103      History   Chief Complaint Chief Complaint  Patient presents with   Generalized Body Aches    HPI Logan Howard is a 45 y.o. male.    Patient presents with nasal congestion, rhinorrhea, nonproductive cough and diarrhea for 1 day.  Daughter tested positive for flu yesterday.  Attempted use of DayQuil and NyQuil which does provide some relief.  Denies headaches, ear pain, sore throat, shortness of breath, wheezing, abdominal pain, nausea, vomiting.  History of asthma, hypertension, DVT.  Past Medical History:  Diagnosis Date   Asthma    DVT (deep venous thrombosis) ([facility])    Hypertension     There are no problems to display for this patient.   Past Surgical History:  Procedure Laterality Date   NO PAST SURGERIES         Home Medications    Prior to Admission medications   Medication Sig Start Date End Date Taking? Authorizing Provider  albuterol (PROVENTIL) (2.5 MG/3ML) 0.083% nebulizer solution Take 3 mLs (2.5 mg total) by nebulization every 4 (four) hours as needed for wheezing or shortness of breath. 04/25/20   Domenick Gong, MD  albuterol (VENTOLIN HFA) 108 (90 Base) MCG/ACT inhaler Inhale 1-2 puffs into the lungs every 6 (six) hours as needed for wheezing or shortness of breath. 04/25/20   Domenick Gong, MD  amoxicillin-clavulanate (AUGMENTIN) 875-125 MG tablet Take 1 tablet by mouth 2 (two) times daily. X 7 days 04/25/20   Domenick Gong, MD  fluticasone [facility]) 50 MCG/ACT nasal spray Place 2 sprays into both nostrils daily. 04/25/20   Domenick Gong, MD  predniSONE (STERAPRED UNI-PAK 21 TAB) 10 MG (21) TBPK tablet Dispense one 6 day pack. Take as directed with food. 04/25/20   Domenick Gong, MD  Spacer/Aero-Holding Chambers (AEROCHAMBER PLUS) inhaler Use as instructed 04/25/20   Domenick Gong, MD    Family History History reviewed. No pertinent family  history.  Social History Social History   Tobacco Use   Smoking status: Every Day    Packs/day: 2.00    Types: Cigarettes   Smokeless tobacco: Never  Vaping Use   Vaping Use: Never used  Substance Use Topics   Alcohol use: Yes    Comment: 8 beers per day   Drug use: Not Currently     Allergies   Patient has no known allergies.   Review of Systems Review of Systems  Constitutional: Negative.   HENT:  Positive for congestion. Negative for dental problem, drooling, ear discharge, ear pain, facial swelling, hearing loss, mouth sores, nosebleeds, postnasal drip, rhinorrhea, sinus pressure, sinus pain, sneezing, sore throat, tinnitus, trouble swallowing and voice change.   Respiratory:  Positive for cough. Negative for apnea, choking, chest tightness, shortness of breath, wheezing and stridor.   Gastrointestinal:  Positive for diarrhea. Negative for abdominal distention, abdominal pain, anal bleeding, blood in stool, constipation, nausea, rectal pain and vomiting.  Skin: Negative.   Neurological: Negative.     Physical Exam Triage Vital Signs ED Triage Vitals  Enc Vitals Group     BP 07/04/21 1148 (!) 160/90     Pulse Rate 07/04/21 1148 72     Resp 07/04/21 1148 18     Temp 07/04/21 1148 98.1 F (36.7 C)     Temp Source 07/04/21 1148 Oral     SpO2 07/04/21 1148 99 %     Weight 07/04/21 1147 (!) 310  lb (140.6 kg)     Height 07/04/21 1147 6\' 4"  (1.93 m)     Head Circumference --      Peak Flow --      Pain Score 07/04/21 1147 2     Pain Loc --      Pain Edu? --      Excl. in [place]? --    No data found.  Updated Vital Signs BP (!) 160/90 (BP Location: Left Arm)   Pulse 72   Temp 98.1 F (36.7 C) (Oral)   Resp 18   Ht 6\' 4"  (1.93 m)   Wt (!) 310 lb (140.6 kg)   SpO2 99%   BMI 37.73 kg/m   Visual Acuity Right Eye Distance:   Left Eye Distance:   Bilateral Distance:    Right Eye Near:   Left Eye Near:    Bilateral Near:     Physical Exam Constitutional:       Appearance: Normal appearance. He is normal weight.  HENT:     Head: Normocephalic.     Right Ear: Tympanic membrane, ear canal and external ear normal.     Left Ear: Tympanic membrane, ear canal and external ear normal.     Nose: Congestion present. No rhinorrhea.     Mouth/Throat:     Mouth: Mucous membranes are moist.     Pharynx: Oropharynx is clear.  Eyes:     Extraocular Movements: Extraocular movements intact.  Cardiovascular:     Rate and Rhythm: Normal rate and regular rhythm.     Pulses: Normal pulses.     Heart sounds: Normal heart sounds.  Pulmonary:     Effort: Pulmonary effort is normal.     Breath sounds: Normal breath sounds.  Musculoskeletal:     Cervical back: Normal range of motion and neck supple.  Skin:    General: Skin is warm and dry.  Neurological:     Mental Status: He is alert and oriented to person, place, and time. Mental status is at baseline.  Psychiatric:        Mood and Affect: Mood normal.        Behavior: Behavior normal.     UC Treatments / Results  Labs (all labs ordered are listed, but only abnormal results are displayed) Labs Reviewed - No data to display  EKG   Radiology No results found.  Procedures Procedures (including critical care time)  Medications Ordered in UC Medications - No data to display  Initial Impression / Assessment and Plan / UC Course  I have reviewed the triage vital signs and the nursing notes.  Pertinent labs & imaging results that were available during my care of the patient were reviewed by me and considered in my medical decision making (see chart for details).  Influenza-like illness  Will defer testing today due to recent exposure in household prior to illness starting  Tamiflu 75 mg twice daily for 5 days Over-the-counter medications for remaining symptom management 3.  Urgent care follow-up as needed Final Clinical Impressions(s) / UC Diagnoses   Final diagnoses:  None   Discharge  Instructions   None    ED Prescriptions   None    PDMP not reviewed this encounter.   13/10/22, NP 07/04/21 1727

## 2021-07-04 NOTE — Discharge Instructions (Signed)
Take tamiflu twice a day for 5 days     You can take Tylenol and/or Ibuprofen as needed for fever reduction and pain relief.   For cough: honey 1/2 to 1 teaspoon (you can dilute the honey in water or another fluid).  You can also use guaifenesin and dextromethorphan for cough. You can use a humidifier for chest congestion and cough.  If you don't have a humidifier, you can sit in the bathroom with the hot shower running.      For sore throat: try warm salt water gargles, cepacol lozenges, throat spray, warm tea or water with lemon/honey, popsicles or ice, or OTC cold relief medicine for throat discomfort.   For congestion: take a daily anti-histamine like Zyrtec, Claritin, and a oral decongestant, such as pseudoephedrine.  You can also use Flonase 1-2 sprays in each nostril daily.   It is important to stay hydrated: drink plenty of fluids (water, gatorade/powerade/pedialyte, juices, or teas) to keep your throat moisturized and help further relieve irritation/discomfort.

## 2021-07-29 ENCOUNTER — Ambulatory Visit
Admission: EM | Admit: 2021-07-29 | Discharge: 2021-07-29 | Disposition: A | Discharge status: Home / Self Care | Payer: Medicaid Other | Attending: Physician Assistant | Admitting: Physician Assistant

## 2021-07-29 ENCOUNTER — Other Ambulatory Visit: Payer: Self-pay

## 2021-07-29 DIAGNOSIS — M25561 Pain in right knee: Secondary | ICD-10-CM

## 2021-07-29 DIAGNOSIS — M23306 Other meniscus derangements, unspecified meniscus, right knee: Secondary | ICD-10-CM

## 2021-07-29 DIAGNOSIS — I10 Essential (primary) hypertension: Secondary | ICD-10-CM | POA: Diagnosis not present

## 2021-07-29 MED ORDER — HYDROCODONE-ACETAMINOPHEN 7.5-325 MG PO TABS: 1.0000 | ORAL_TABLET | Freq: Four times a day (QID) | ORAL | 0 refills | Status: AC | PRN

## 2021-07-29 MED ORDER — NAPROXEN 500 MG PO TABS: 500.0000 mg | ORAL_TABLET | Freq: Two times a day (BID) | ORAL | 0 refills | Status: AC | PRN

## 2021-07-29 NOTE — ED Provider Notes (Signed)
[facility]    CSN: [id number] Arrival date & time: 07/29/21  1306      History   Chief Complaint Chief Complaint  Patient presents with   Knee Pain    HPI Logan Howard is a 45 y.o. male presenting for right-sided knee pain for the past 2.5 days.  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  He reports over the past couple of days he has had significant pain in the right knee.  Reports severe pain whenever he tries to bend his knee back.  Reports that he has been more active at work recently with going up and down ladders.  No specific injury.  Patient says he was seen in 2016 at an orthopedics office and was given corticosteroid injection into his knees and also had fluid removed.  He is concerned he may need to have that done again.  He did contact Ortho office but says he cannot be seen until Wednesday and his pain is very severe.  Has taken over-the-counter Aleve without improvement in pain.  Patient says he has so much pain that it is difficult for him to drive.  He says he has to drive with both feet because moving the right leg causes a lot of pain in the knee.  No numbness or weakness or instability.  No other complaints.  HPI  Past Medical History:  Diagnosis Date   Asthma    DVT (deep venous thrombosis) (HCC)    Hypertension     There are no problems to display for this patient.   Past Surgical History:  Procedure Laterality Date   NO PAST SURGERIES         Home Medications    Prior to Admission medications   Medication Sig Start Date End Date Taking? Authorizing Provider  HYDROcodone-acetaminophen (NORCO) 7.5-325 MG tablet Take 1 tablet by mouth every 6 (six) hours as needed for up to 2 days for moderate pain. 07/29/21 07/31/21 Yes Eusebio Friendly B, PA-C  naproxen (NAPROSYN) 500 MG tablet Take 1 tablet (500 mg total) by mouth 2 (two) times daily as needed for up to 10 days. 07/29/21 08/08/21 Yes Shirlee Latch PA-C    Family History History reviewed. No pertinent family history.  Social History Social History   Tobacco Use   Smoking status: Every Day    Packs/day: 2.00    Types: Cigarettes   Smokeless tobacco: Never  Vaping Use   Vaping Use: Never used  Substance Use Topics   Alcohol use: Yes    Comment: 8 beers per day   Drug use: Not Currently     Allergies   Patient has no known allergies.   Review of Systems Review of Systems  Constitutional:  Negative for fatigue and fever.  Musculoskeletal:  Positive for arthralgias, gait problem and joint swelling.  Skin:  Negative for color change, rash and wound.  Neurological:  Negative for weakness and numbness.    Physical Exam Triage Vital Signs ED Triage Vitals  Enc Vitals Group     BP 07/29/21 1350 (!) 202/113     Pulse Rate 07/29/21 1350 68     Resp 07/29/21 1350 19     Temp 07/29/21 1350 98.4 F (36.9 C)     Temp src --      SpO2 07/29/21 1350 98 %     Weight --      Height --      Head Circumference --  Peak Flow --      Pain Score 07/29/21 1349 10     Pain Loc --      Pain Edu? --      Excl. in GC? --    No data found.  Updated Vital Signs BP (!) 192/117 (BP Location: Left Arm)   Pulse 68   Temp 98.4 F (36.9 C)   Resp 19   SpO2 98%       Physical Exam Vitals and nursing note reviewed.  Constitutional:      General: He is not in acute distress.    Appearance: Normal appearance. He is well-developed. He is obese. He is not ill-appearing.  HENT:     Head: Normocephalic and atraumatic.  Eyes:     General: No scleral icterus.    Conjunctiva/sclera: Conjunctivae normal.  Cardiovascular:     Rate and Rhythm: Normal rate and regular rhythm.  Pulmonary:     Effort: Pulmonary effort is normal. No respiratory distress.     Breath sounds: Normal breath sounds.  Musculoskeletal:     Cervical back: Neck supple.     Right knee: Swelling (mild swelling medial anterior knee) present. No  effusion. Decreased range of motion. Tenderness (decreased ROM  beyond 90 degrees, prefers to keep knee at 45 degrees extension) present over the medial joint line. Abnormal meniscus.     Left knee: No swelling. Normal range of motion. No tenderness.     Comments: Patient winces when he moves his right knee.  Appears to be in a lot of pain.  Skin:    General: Skin is warm and dry.     Capillary Refill: Capillary refill takes less than 2 seconds.  Neurological:     General: No focal deficit present.     Mental Status: He is alert. Mental status is at baseline.     Motor: No weakness.     Coordination: Coordination normal.     Gait: Gait abnormal.  Psychiatric:        Mood and Affect: Mood normal.        Behavior: Behavior normal.        Thought Content: Thought content normal.     UC Treatments / Results  Labs (all labs ordered are listed, but only abnormal results are displayed) Labs Reviewed - No data to display  EKG   Radiology No results found.  Procedures Procedures (including critical care time)  Medications Ordered in UC Medications - No data to display  Initial Impression / Assessment and Plan / UC Course  I have reviewed the triage vital signs and the nursing notes.  Pertinent labs & imaging results that were available during my care of the patient were reviewed by me and considered in my medical decision making (see chart for details).  45 year old male presenting for severe right knee pain over the past couple of days.  No specific injury but does have known degenerative meniscus of bilateral knees.  Reports pain from time to time in both knees but nothing severe like he currently has.  Presentation today is consistent with likely flareup of degenerative disease of knee.  I have advised him to contact Ortho to make an appointment.  Sent naproxen to pharmacy.  Also short supply of hydrocodone.  Reviewed controlled substance database.  Reviewed RICE guidelines.   Patient also given a supportive knee brace.  Discussed elevated blood pressure and handout.  Advised following up with PCP to help gain better control of blood pressure.  However, some of the increase in blood pressure could be related to his severe knee pain.   Final Clinical Impressions(s) / UC Diagnoses   Final diagnoses:  Right knee pain, unspecified chronicity  Meniscus degeneration, right  Essential hypertension     Discharge Instructions      -I suspect you are having a flareup of your chronic knee pain due to meniscus degeneration. - Ice the area and keep it elevated.  We have provided you with a supportive knee brace. - You can use over-the-counter Voltaren gel to help with pain.  I have also sent naproxen so do not take any other anti-inflammatory medications when you are taking it.  If you absolutely need, I have sent hydrocodone for pain.  I given you a short supply because I would like you to make an appointment with orthopedics.  You would likely benefit from a corticosteroid injection especially since that is helped in the past. -Your blood pressure is very high.  It could be in part due to your pain.  You should make sure to take your medications regularly.  If blood pressures consistently greater than 140/90 you should follow-up with your PCP as you may need to have blood pressure medication adjusted.  Uncontrolled hypertension can lead to heart attack, stroke and other significant illnesses.     ED Prescriptions     Medication Sig Dispense Auth. Provider   naproxen (NAPROSYN) 500 MG tablet Take 1 tablet (500 mg total) by mouth 2 (two) times daily as needed for up to 10 days. 20 tablet Shirlee Latch, PA-C   HYDROcodone-acetaminophen (NORCO) 7.5-325 MG tablet Take 1 tablet by mouth every 6 (six) hours as needed for up to 2 days for moderate pain. 6 tablet Shirlee Latch, PA-C      I have reviewed the PDMP during this encounter.   Shirlee Latch, PA-C 07/29/21  [phone]

## 2021-07-29 NOTE — ED Triage Notes (Signed)
Pt presents with complaints of bilateral knee pain that he has had going on for years. He used to go to an ortho office for it but has not been since 2016. Reports the pain has been unbearable for 2 days now. They cannot see him at the ortho office until Wednesday. The pain is affecting his ability to drive.

## 2021-07-29 NOTE — Discharge Instructions (Addendum)
-  I suspect you are having a flareup of your chronic knee pain due to meniscus degeneration. - Ice the area and keep it elevated.  We have provided you with a supportive knee brace. - You can use over-the-counter Voltaren gel to help with pain.  I have also sent naproxen so do not take any other anti-inflammatory medications when you are taking it.  If you absolutely need, I have sent hydrocodone for pain.  I given you a short supply because I would like you to make an appointment with orthopedics.  You would likely benefit from a corticosteroid injection especially since that is helped in the past. -Your blood pressure is very high.  It could be in part due to your pain.  You should make sure to take your medications regularly.  If blood pressures consistently greater than 140/90 you should follow-up with your PCP as you may need to have blood pressure medication adjusted.  Uncontrolled hypertension can lead to heart attack, stroke and other significant illnesses.

## 2021-07-30 ENCOUNTER — Emergency Department
Admission: EM | Admit: 2021-07-30 | Discharge: 2021-07-30 | Disposition: A | Discharge status: Home / Self Care | Payer: Medicaid Other | Attending: Emergency Medicine | Admitting: Emergency Medicine

## 2021-07-30 ENCOUNTER — Other Ambulatory Visit: Payer: Self-pay

## 2021-07-30 DIAGNOSIS — I10 Essential (primary) hypertension: Secondary | ICD-10-CM

## 2021-07-30 DIAGNOSIS — Z79899 Other long term (current) drug therapy: Secondary | ICD-10-CM | POA: Insufficient documentation

## 2021-07-30 DIAGNOSIS — J45909 Unspecified asthma, uncomplicated: Secondary | ICD-10-CM | POA: Diagnosis not present

## 2021-07-30 DIAGNOSIS — F17210 Nicotine dependence, cigarettes, uncomplicated: Secondary | ICD-10-CM | POA: Diagnosis not present

## 2021-07-30 LAB — BASIC METABOLIC PANEL
Anion gap: 4 — ABNORMAL LOW (ref 5–15)
BUN: 14 mg/dL (ref 6–20)
CO2: 29 mmol/L (ref 22–32)
Calcium: 9 mg/dL (ref 8.9–10.3)
Chloride: 102 mmol/L (ref 98–111)
Creatinine, Ser: 0.77 mg/dL (ref 0.61–1.24)
GFR, Estimated: >60 mL/min (ref >60)
Glucose, Bld: 98 mg/dL (ref 70–99)
Potassium: 4.4 mmol/L (ref 3.5–5.1)
Sodium: 135 mmol/L (ref 135–145)

## 2021-07-30 LAB — CBC
HCT: 44.1 % (ref 39.0–52.0)
Hemoglobin: 15.5 g/dL (ref 13.0–17.0)
MCH: 33.5 pg (ref 26.0–34.0)
MCHC: 35.1 g/dL (ref 30.0–36.0)
MCV: 95.2 fL (ref 80.0–100.0)
Platelets: 214 10*3/uL (ref 150–400)
RBC: 4.63 MIL/uL (ref 4.22–5.81)
RDW: 11.8 % (ref 11.5–15.5)
WBC: 7.6 10*3/uL (ref 4.0–10.5)
nRBC: 0 % (ref 0.0–0.2)

## 2021-07-30 MED ORDER — LISINOPRIL 10 MG PO TABS: 10.0000 mg | ORAL_TABLET | Freq: Every day | ORAL | 11 refills | Status: DC

## 2021-07-30 NOTE — ED Notes (Signed)
Pt here for elevated BP that started yesterday; no hx HTN.

## 2021-07-30 NOTE — ED Provider Notes (Signed)
Medical screening examination/treatment/procedure(s) were conducted as a shared visit with non-physician practitioner(s) and myself.  I personally evaluated the patient during the encounter.   Personally saw and evaluated the patient.  Blood pressure is high was also noted at urgent care.  Does not currently have a primary care doctor but has an appointment coming up at the end of December now he made appointment today  He is completely asymptomatic with regard to symptoms of hypertensive urgency or emergency.  Specifically no chest pain no headache.  Incidentally noticed blood pressure was high as he was getting evaluated for chronic right knee issue  Personally discussed risks and benefits of lisinopril with the patient including what to do if angioedema is to occur, PA [place] plan to start him on lisinopril he has primary care follow-up already set up   Sharyn Creamer, MD 07/30/21 1530

## 2021-07-30 NOTE — ED Triage Notes (Signed)
Pt comes with c/o HTN that started yesterday. Pt denies any dizziness or headache. Pt denies any BP hx in past.

## 2021-07-30 NOTE — ED Provider Notes (Signed)
[facility] Emergency Department Provider Note    ____________________________________________   Event Date/Time   First MD Initiated Contact with Patient 07/30/21 1522     (approximate)  I have reviewed the triage vital signs and the nursing notes.   HISTORY  Chief Complaint Hypertension   HPI Logan Howard is a 45 y.o. male, history of asthma, hypertension, DVT, presents to the emergency department for evaluation of hypertension.  Patient states that he was at a orthopedic appointment this morning for evaluation of right knee meniscal injury when they noticed that his blood pressure was 230/142.  They recommended that he be evaluated in the emergency department promptly.  Patient states that he is currently asymptomatic.  Denies headache, dizziness, chest pain, shortness of breath, abdominal pain, back pain, or fever/chills.  He is not currently on any blood pressure medications.  He made an attempt to schedule an appointment to establish with a primary care provider, but found that he cannot get an appointment for at least another month.  He is currently requesting medications for his blood pressure to keep him treated until he can see a PCP.  History limited by: No limitations.  Past Medical History:  Diagnosis Date   Asthma    DVT (deep venous thrombosis) ([place])    Hypertension     There are no problems to display for this patient.   Past Surgical History:  Procedure Laterality Date   NO PAST SURGERIES      Prior to Admission medications   Medication Sig Start Date End Date Taking? Authorizing Provider  lisinopril (ZESTRIL) 10 MG tablet Take 1 tablet (10 mg total) by mouth daily. 07/30/21 07/30/22 Yes Teodoro Spray, PA  HYDROcodone-acetaminophen (NORCO) 7.5-325 MG tablet Take 1 tablet by mouth every 6 (six) hours as needed for up to 2 days for moderate pain. 07/29/21 07/31/21  Laurene Footman B, PA-C  naproxen (NAPROSYN) 500 MG tablet Take  1 tablet (500 mg total) by mouth 2 (two) times daily as needed for up to 10 days. 07/29/21 08/08/21  Danton Clap, PA-C    Allergies Patient has no known allergies.  No family history on file.  Social History Social History   Tobacco Use   Smoking status: Every Day    Packs/day: 2.00    Types: Cigarettes   Smokeless tobacco: Never  Vaping Use   Vaping Use: Never used  Substance Use Topics   Alcohol use: Yes    Comment: 8 beers per day   Drug use: Not Currently    Review of Systems  Constitutional: Negative for fever/chills, weight loss, or fatigue.  Eyes: Negative for visual changes or discharge.  ENT: Negative for congestion, hearing changes, or sore throat.  Gastrointestinal: Negative for abdominal pain, nausea/vomiting, or diarrhea.  Genitourinary: Negative for dysuria or hematuria.  Musculoskeletal: Negative for back pain or joint pain.  Skin: Negative for rashes or lesions.  Neurological: Negative for headache, syncope, dizziness, tremors, or numbness/tingling.   10-point ROS otherwise negative. ____________________________________________   PHYSICAL EXAM:  VITAL SIGNS: ED Triage Vitals  Enc Vitals Group     BP 07/30/21 1355 (!) 214/129     Pulse Rate 07/30/21 1355 65     Resp 07/30/21 1355 18     Temp 07/30/21 1355 98.1 F (36.7 C)     Temp Source 07/30/21 1355 Oral     SpO2 07/30/21 1355 100 %     Weight --      Height --  Head Circumference --      Peak Flow --      Pain Score 07/30/21 1352 0     Pain Loc --      Pain Edu? --      Excl. in GC? --     Physical Exam Constitutional:      Appearance: Normal appearance.  HENT:     Head: Normocephalic and atraumatic.     Nose: Nose normal.     Mouth/Throat:     Mouth: Mucous membranes are moist.     Pharynx: Oropharynx is clear. No oropharyngeal exudate or posterior oropharyngeal erythema.  Eyes:     Extraocular Movements: Extraocular movements intact.     Conjunctiva/sclera:  Conjunctivae normal.     Pupils: Pupils are equal, round, and reactive to light.  Cardiovascular:     Rate and Rhythm: Normal rate and regular rhythm.     Pulses: Normal pulses.     Heart sounds: Normal heart sounds. No murmur heard.   No friction rub. No gallop.  Pulmonary:     Effort: Pulmonary effort is normal. No respiratory distress.     Breath sounds: Normal breath sounds. No stridor. No wheezing or rhonchi.  Abdominal:     General: Abdomen is flat. There is no distension.     Palpations: Abdomen is soft.     Tenderness: There is no abdominal tenderness. There is no guarding.  Musculoskeletal:        General: Normal range of motion.     Cervical back: Normal range of motion and neck supple.  Skin:    General: Skin is warm and dry.  Neurological:     Mental Status: He is alert and oriented to person, place, and time.  Psychiatric:        Mood and Affect: Mood normal.        Behavior: Behavior normal.        Thought Content: Thought content normal.        Judgment: Judgment normal.     ____________________________________________    LABS  (all labs ordered are listed, but only abnormal results are displayed)  Labs Reviewed  BASIC METABOLIC PANEL - Abnormal; Notable for the following components:      Result Value   Anion gap 4 (*)    All other components within normal limits  CBC     ____________________________________________   EKG Sinus rhythm, rate of 65, no ST segment changes, no axis deviations, no AV blocks, no prolonged QT.     ____________________________________________    RADIOLOGY I personally viewed and evaluated these images as part of my medical decision making, as well as reviewing the written report by the radiologist.  ED Provider Interpretation: Not applicable.  No results found.  ____________________________________________   PROCEDURES  Procedures   Medications - No data to display  Critical Care performed: No   ____________________________________________   INITIAL IMPRESSION / ASSESSMENT AND PLAN / ED COURSE  Pertinent labs & imaging results that were available during my care of the patient were reviewed by me and considered in my medical decision making (see chart for details).        Logan Howard is a 45 y.o. male, history of asthma, hypertension, DVT, presents to the emergency department for evaluation of hypertension.  Patient states that he was at a orthopedic appointment this morning for evaluation of right knee meniscal injury when they noticed that his blood pressure was 230/142.  They recommended that he be  evaluated in the emergency department promptly.  Patient states that he is currently asymptomatic.  Denies headache, dizziness, chest pain, shortness of breath, abdominal pain, back pain, or fever/chills.  He is not currently on any blood pressure medications.  He made an attempt to schedule an appointment to establish with a primary care provider, but found that he cannot get an appointment for at least another month.  He is currently requesting medications for his blood pressure to keep him treated until he can see a PCP.  Upon presentation, patient appears well.  He is sitting upright, comfortably in bed.  NAD.  Physical exam is unremarkable.  No murmurs, rubs, gallops when auscultating the heart.  Lung sounds clear bilaterally.  No neurological findings.   EKG is unremarkable.  Sinus rhythm with no ST segment changes or axis deviations.  No signs of LVH.  CBC shows no leukocytosis or anemia.  BMP shows normal creatinine.  No electrolyte abnormalities.  After discussing my findings with the patient, he states that he is not looking for any further work-up in the emergency department, He would only like blood pressure medication to hold him over until his first appointment with his primary care provider in 4 weeks.  Given his reassuring labs, EKG, and physical exam I do not suspect  any complications secondary to his hypertension.  I will start him on lisinopril 10 mg p.o. daily.  Discussed with the patient the potential side effects of this medication, including angioedema and dry cough.  Patient understands and he is still willing to take the medication.  We will plan to discharge the patient with anticipatory guidance, strict return precautions, and follow-up with his primary care provider as discussed for further hypertension management.  Encouraged the patient to return to the emergency department if his symptoms worsen.      ____________________________________________   FINAL CLINICAL IMPRESSION(S) / ED DIAGNOSES  Final diagnoses:  Hypertension, unspecified type     NEW MEDICATIONS STARTED DURING THIS VISIT:  ED Discharge Orders          Ordered    lisinopril (ZESTRIL) 10 MG tablet  Daily        07/30/21 1616             Note:  This document was prepared using Dragon voice recognition software and may include unintentional dictation errors.    Teodoro Spray, [place] 07/31/21 1008    Vanessa Boqueron, MD 07/31/21 [phone]

## 2021-08-16 ENCOUNTER — Encounter: Payer: Self-pay | Admitting: Nurse Practitioner

## 2021-08-16 ENCOUNTER — Other Ambulatory Visit: Payer: Self-pay

## 2021-08-16 ENCOUNTER — Ambulatory Visit: Payer: Medicaid Other | Admitting: Nurse Practitioner

## 2021-08-16 VITALS — BP 204/117 | HR 73 | Temp 98.7°F | Resp 16 | Ht 76.0 in | Wt 319.2 lb

## 2021-08-16 DIAGNOSIS — I160 Hypertensive urgency: Secondary | ICD-10-CM | POA: Diagnosis not present

## 2021-08-16 DIAGNOSIS — M1710 Unilateral primary osteoarthritis, unspecified knee: Secondary | ICD-10-CM

## 2021-08-16 DIAGNOSIS — Z7689 Persons encountering health services in other specified circumstances: Secondary | ICD-10-CM

## 2021-08-16 DIAGNOSIS — Z23 Encounter for immunization: Secondary | ICD-10-CM

## 2021-08-16 DIAGNOSIS — J0110 Acute frontal sinusitis, unspecified: Secondary | ICD-10-CM | POA: Diagnosis not present

## 2021-08-16 MED ORDER — HYDROCHLOROTHIAZIDE 12.5 MG PO TABS: 12.5000 mg | ORAL_TABLET | Freq: Every day | ORAL | 1 refills | Status: DC

## 2021-08-16 MED ORDER — AMOXICILLIN-POT CLAVULANATE 875-125 MG PO TABS: 1.0000 | ORAL_TABLET | Freq: Two times a day (BID) | ORAL | 0 refills | Status: DC

## 2021-08-16 MED ORDER — TETANUS-DIPHTH-ACELL PERTUSSIS 5-2.5-18.5 LF-MCG/0.5 IM SUSP: 0.5000 mL | Freq: Once | INTRAMUSCULAR | 0 refills | Status: AC

## 2021-08-16 MED ORDER — LISINOPRIL 20 MG PO TABS: 20.0000 mg | ORAL_TABLET | Freq: Every day | ORAL | 1 refills | Status: DC

## 2021-08-16 MED ORDER — TRAMADOL HCL 50 MG PO TABS: 50.0000 mg | ORAL_TABLET | Freq: Three times a day (TID) | ORAL | 0 refills | Status: AC | PRN

## 2021-08-16 NOTE — Progress Notes (Signed)
[facility] [address] [place], [place] [address]  Internal MEDICINE  Office Visit Note  Patient Name: Logan Howard  [id number]  [id number]  Date of Service: 08/16/2021   Complaints/HPI Pt is here for establishment of PCP. Chief Complaint  Patient presents with   New Patient (Initial Visit)   Hypertension   Asthma   HPI Logan Howard presents for a new patient visit to establish care. He is a well appearing 45 yo male with significantly elevated blood pressure and asthma. He has had no major surgeries. He went to the ER on 07/30/21 and had significantly elevated blood pressure. He was told that he needed to establish care with a PCP to get his high blood pressure addressed. The patient is also morbidly obese which contributes to the high blood pressure. He has "bad knees" He reports that he previously fell from the second floor off a ladder and landed on the ladder and concrete.  He also has symptoms of a sinus infection. He reports sores throat, sinus pressure, nasal congestion, and cough.     Current Medication: Outpatient Encounter Medications as of 08/16/2021  Medication Sig   amoxicillin-clavulanate (AUGMENTIN) 875-125 MG tablet Take 1 tablet by mouth 2 (two) times daily.   hydrochlorothiazide (HYDRODIURIL) 12.5 MG tablet Take 1 tablet (12.5 mg total) by mouth daily.   lisinopril (ZESTRIL) 20 MG tablet Take 1 tablet (20 mg total) by mouth daily.   naproxen (NAPROSYN) 500 MG tablet Take 500 mg by mouth 2 (two) times daily with a meal.   [DISCONTINUED] lisinopril (ZESTRIL) 10 MG tablet Take 1 tablet (10 mg total) by mouth daily.   [DISCONTINUED] Tdap (BOOSTRIX) 5-2.5-18.5 LF-MCG/0.5 injection Inject 0.5 mLs into the muscle once.   Tdap (BOOSTRIX) 5-2.5-18.5 LF-MCG/0.5 injection Inject 0.5 mLs into the muscle once for 1 dose.   No facility-administered encounter medications on file as of 08/16/2021.    Surgical History: Past Surgical History:  Procedure  Laterality Date   NO PAST SURGERIES      Medical History: Past Medical History:  Diagnosis Date   Asthma    DVT (deep venous thrombosis) (HCC)    Hypertension     Family History: Family History  Problem Relation Age of Onset   Diabetes Mother    Hypertension Mother    Arthritis Mother    Obesity Father    COPD Father    Arthritis Father    Cancer Father    Cancer Paternal Aunt    Cancer Paternal Uncle     Social History   Socioeconomic History   Marital status: Single    Spouse name: Not on file   Number of children: Not on file   Years of education: Not on file   Highest education level: Not on file  Occupational History   Not on file  Tobacco Use   Smoking status: Every Day    Packs/day: 2.00    Types: Cigarettes   Smokeless tobacco: Never  Vaping Use   Vaping Use: Never used  Substance and Sexual Activity   Alcohol use: Yes    Comment: 8 beers per day   Drug use: Not Currently   Sexual activity: Not on file  Other Topics Concern   Not on file  Social History Narrative   Not on file   Social Determinants of Health   Financial Resource Strain: Not on file  Food Insecurity: Not on file  Transportation Needs: Not on file  Physical Activity: Not on file  Stress: Not on file  Social Connections: Not on file  Intimate Partner Violence: Not on file     Review of Systems  Constitutional:  Negative for chills, fatigue and fever.  HENT:  Positive for congestion, postnasal drip, rhinorrhea, sinus pressure, sinus pain and sore throat. Negative for ear pain.   Respiratory:  Positive for cough. Negative for chest tightness, shortness of breath and wheezing.   Cardiovascular: Negative.  Negative for chest pain and palpitations.  Gastrointestinal:  Negative for abdominal pain, constipation, diarrhea, nausea and vomiting.  Musculoskeletal:  Negative for myalgias.  Skin:  Negative for rash.  Neurological:  Positive for headaches.   Vital Signs: BP (!)  204/117    Pulse 73    Temp 98.7 F (37.1 C)    Resp 16    Ht 6\' 4"  (1.93 m)    Wt (!) 319 lb 3.2 oz (144.8 kg)    SpO2 97%    BMI 38.85 kg/m    Physical Exam Vitals reviewed.  Constitutional:      Appearance: Normal appearance. He is obese.  HENT:     Head: Normocephalic and atraumatic.     Right Ear: Tympanic membrane, ear canal and external ear normal.     Left Ear: Tympanic membrane, ear canal and external ear normal.     Nose: Congestion and rhinorrhea present.     Mouth/Throat:     Mouth: Mucous membranes are moist.     Pharynx: Posterior oropharyngeal erythema present. No oropharyngeal exudate.  Eyes:     Pupils: Pupils are equal, round, and reactive to light.  Cardiovascular:     Heart sounds: Normal heart sounds. No murmur heard. Pulmonary:     Effort: Pulmonary effort is normal. No respiratory distress.  Neurological:     Mental Status: He is alert and oriented to person, place, and time.     Cranial Nerves: No cranial nerve deficit.     Coordination: Coordination normal.     Gait: Gait normal.  Psychiatric:        Mood and Affect: Mood normal.        Behavior: Behavior normal.      Assessment/Plan: 1. Hypertensive urgency Lisinopril dose increased and HCTZ added. Follow up in 1 month.  - lisinopril (ZESTRIL) 20 MG tablet; Take 1 tablet (20 mg total) by mouth daily.  Dispense: 30 tablet; Refill: 1 - hydrochlorothiazide (HYDRODIURIL) 12.5 MG tablet; Take 1 tablet (12.5 mg total) by mouth daily.  Dispense: 30 tablet; Refill: 1  2. Acute non-recurrent frontal sinusitis Empiric antibiotic treatment prescribed.  - amoxicillin-clavulanate (AUGMENTIN) 875-125 MG tablet; Take 1 tablet by mouth 2 (two) times daily.  Dispense: 20 tablet; Refill: 0  3. Arthritis of knee Tramadol and naproxen prescribed for arthritis - naproxen (NAPROSYN) 500 MG tablet; Take 500 mg by mouth 2 (two) times daily with a meal. - traMADol (ULTRAM) 50 MG tablet; Take 1 tablet (50 mg total) by  mouth every 8 (eight) hours as needed for up to 5 days for moderate pain or severe pain.  Dispense: 15 tablet; Refill: 0  4. Need for vaccination - Tdap (BOOSTRIX) 5-2.5-18.5 LF-MCG/0.5 injection; Inject 0.5 mLs into the muscle once for 1 dose.  Dispense: 0.5 mL; Refill: 0  5. Encounter to establish care with new doctor Need to establish with PCP after being seen in ER for significantly elevated blood pressure.     General Counseling: ryann leavitt understanding of the findings of todays visit and agrees with plan of  treatment. I have discussed any further diagnostic evaluation that may be needed or ordered today. We also reviewed his medications today. he has been encouraged to call the office with any questions or concerns that should arise related to todays visit.    Counseling:  [place] Controlled Substance Database was reviewed by me.  No orders of the defined types were placed in this encounter.   Meds ordered this encounter  Medications   Tdap (BOOSTRIX) 5-2.5-18.5 LF-MCG/0.5 injection    Sig: Inject 0.5 mLs into the muscle once for 1 dose.    Dispense:  0.5 mL    Refill:  0   lisinopril (ZESTRIL) 20 MG tablet    Sig: Take 1 tablet (20 mg total) by mouth daily.    Dispense:  30 tablet    Refill:  1   hydrochlorothiazide (HYDRODIURIL) 12.5 MG tablet    Sig: Take 1 tablet (12.5 mg total) by mouth daily.    Dispense:  30 tablet    Refill:  1   amoxicillin-clavulanate (AUGMENTIN) 875-125 MG tablet    Sig: Take 1 tablet by mouth 2 (two) times daily.    Dispense:  20 tablet    Refill:  0    Return in about 1 month (around 09/16/2021) for F/U, BP check, Onesimo Lingard PCP, also need CPE at earliest available opening. .  Time spent:30 Minutes Time spent with patient included reviewing progress notes, labs, imaging studies, and discussing plan for follow up.   This patient was seen by Sallyanne Kuster, FNP-C in collaboration with Dr. Beverely Risen as a part of collaborative care  agreement.  Simcha Farrington R. Tedd Sias, MSN, FNP-C Internal Medicine

## 2021-09-20 ENCOUNTER — Ambulatory Visit: Payer: Medicaid Other | Admitting: Nurse Practitioner

## 2021-09-22 ENCOUNTER — Encounter: Payer: Self-pay | Admitting: Nurse Practitioner

## 2021-09-25 ENCOUNTER — Ambulatory Visit: Payer: Medicaid Other | Admitting: Nurse Practitioner

## 2021-10-08 ENCOUNTER — Telehealth: Payer: Self-pay

## 2021-10-08 NOTE — Telephone Encounter (Signed)
Left vm to confirm 10/09/21 appointment-Toni °

## 2021-10-09 ENCOUNTER — Encounter: Payer: Medicaid Other | Admitting: Nurse Practitioner

## 2021-10-09 DIAGNOSIS — Z0289 Encounter for other administrative examinations: Secondary | ICD-10-CM

## 2021-10-10 ENCOUNTER — Other Ambulatory Visit: Payer: Self-pay

## 2021-10-10 ENCOUNTER — Ambulatory Visit
Admission: EM | Admit: 2021-10-10 | Discharge: 2021-10-10 | Disposition: A | Discharge status: Home / Self Care | Payer: Medicaid Other | Attending: Internal Medicine | Admitting: Internal Medicine

## 2021-10-10 DIAGNOSIS — J40 Bronchitis, not specified as acute or chronic: Secondary | ICD-10-CM

## 2021-10-10 MED ORDER — PREDNISONE 20 MG PO TABS: 20.0000 mg | ORAL_TABLET | Freq: Every day | ORAL | 0 refills | Status: DC

## 2021-10-10 MED ORDER — ALBUTEROL SULFATE HFA 108 (90 BASE) MCG/ACT IN AERS: 2.0000 | INHALATION_SPRAY | RESPIRATORY_TRACT | 0 refills | Status: DC | PRN

## 2021-10-10 MED ORDER — ALBUTEROL SULFATE 2.5 MG/0.5ML IN NEBU: 1.0000 | INHALATION_SOLUTION | RESPIRATORY_TRACT | 0 refills | Status: DC

## 2021-10-10 MED ORDER — DOXYCYCLINE HYCLATE 100 MG PO CAPS: 100.0000 mg | ORAL_CAPSULE | Freq: Two times a day (BID) | ORAL | 0 refills | Status: DC

## 2021-10-10 NOTE — Discharge Instructions (Addendum)
Get back on your blood right away Your top number needs to be 140 or less and the bottom 70-80's

## 2021-10-10 NOTE — ED Provider Notes (Signed)
[facility]    CSN: [id number] Arrival date & time: 10/10/21  1556      History   Chief Complaint Chief Complaint  Patient presents with   Cough    HPI Logan Howard is a 47 y.o. male who presents with URI x 2 days. He started with a cough. He is a smoker and has hx of asthma. Is out of his inhaler and ampules for his neb machine. He also states he has not remembered to take his BP medication in 2 weeks due to father's death and other job stressors.     Past Medical History:  Diagnosis Date   Asthma    DVT (deep venous thrombosis) (HCC)    Hypertension     There are no problems to display for this patient.   Past Surgical History:  Procedure Laterality Date   NO PAST SURGERIES         Home Medications    Prior to Admission medications   Medication Sig Start Date End Date Taking? Authorizing Provider  albuterol (VENTOLIN HFA) 108 (90 Base) MCG/ACT inhaler Inhale 2 puffs into the lungs every 4 (four) hours as needed for wheezing or shortness of breath. 10/10/21  Yes Rodriguez-Southworth, Nettie Elm, PA-C  Albuterol Sulfate 2.5 MG/0.5ML NEBU Inhale 0.5 mLs (2.5 mg total) into the lungs every 4 (four) hours. 10/10/21  Yes Rodriguez-Southworth, Nettie Elm, PA-C  doxycycline (VIBRAMYCIN) 100 MG capsule Take 1 capsule (100 mg total) by mouth 2 (two) times daily. 10/10/21  Yes Rodriguez-Southworth, Nettie Elm, PA-C  hydrochlorothiazide (HYDRODIURIL) 12.5 MG tablet Take 1 tablet (12.5 mg total) by mouth daily. 08/16/21  Yes Abernathy, Arlyss Repress, NP  lisinopril (ZESTRIL) 20 MG tablet Take 1 tablet (20 mg total) by mouth daily. 08/16/21  Yes Abernathy, Arlyss Repress, NP  predniSONE (DELTASONE) 20 MG tablet Take 1 tablet (20 mg total) by mouth daily with breakfast. 10/10/21  Yes Rodriguez-Southworth, Nettie Elm, PA-C  naproxen (NAPROSYN) 500 MG tablet Take 500 mg by mouth 2 (two) times daily with a meal.    [provider]    Family History Family History  Problem Relation  Age of Onset   Diabetes Mother    Hypertension Mother    Arthritis Mother    Obesity Father    COPD Father    Arthritis Father    Cancer Father    Cancer Paternal Aunt    Cancer Paternal Uncle     Social History Social History   Tobacco Use   Smoking status: Every Day    Packs/day: 2.00    Types: Cigarettes   Smokeless tobacco: Never  Vaping Use   Vaping Use: Never used  Substance Use Topics   Alcohol use: Yes    Comment: 8 beers per day   Drug use: Not Currently     Allergies   Patient has no known allergies.   Review of Systems Review of Systems  Constitutional:  Negative for appetite change, chills, diaphoresis, fatigue and fever.  HENT:  Negative for congestion, ear discharge, ear pain, postnasal drip, rhinorrhea and sore throat.   Eyes:  Negative for discharge.  Respiratory:  Positive for cough and wheezing. Negative for chest tightness.   Cardiovascular:  Negative for chest pain.  Skin:  Negative for rash.  Neurological:  Negative for headaches.  Hematological:  Negative for adenopathy.    Physical Exam Triage Vital Signs ED Triage Vitals  Enc Vitals Group     BP 10/10/21 1606 (!) 177/103     Pulse Rate  10/10/21 1606 80     Resp 10/10/21 1606 (!) 22     Temp 10/10/21 1606 99 F (37.2 C)     Temp Source 10/10/21 1606 Oral     SpO2 10/10/21 1606 97 %     Weight 10/10/21 1608 (!) 310 lb (140.6 kg)     Height 10/10/21 1608 6\' 4"  (1.93 m)     Head Circumference --      Peak Flow --      Pain Score 10/10/21 1606 0     Pain Loc --      Pain Edu? --      Excl. in GC? --    No data found.  Updated Vital Signs BP (!) 177/103 (BP Location: Left Arm)    Pulse 80    Temp 99 F (37.2 C) (Oral)    Resp (!) 22    Ht 6\' 4"  (1.93 m)    Wt (!) 310 lb (140.6 kg)    SpO2 97%    BMI 37.73 kg/m   Visual Acuity Right Eye Distance:   Left Eye Distance:   Bilateral Distance:    Right Eye Near:   Left Eye Near:    Bilateral Near:     Physical Exam Physical  Exam Constitutional:      General: He is not in acute distress.    Appearance: He is not toxic-appearing.  HENT:     Head: Normocephalic.     Right Ear: Tympanic membrane, ear canal and external ear normal.     Left Ear: Ear canal and external ear normal.     Nose: Nose normal.     Mouth/Throat:     Mouth: Mucous membranes are moist.     Pharynx: Oropharynx is clear.  Eyes:     General: No scleral icterus.    Conjunctiva/sclera: Conjunctivae normal.  Cardiovascular:     Rate and Rhythm: Normal rate and regular rhythm.     Heart sounds: No murmur heard.   Pulmonary:     Effort: Pulmonary effort is normal. No respiratory distress.     Breath sounds: Wheezing present.     Comments: Has auditory wheezing Musculoskeletal:        General: Normal range of motion.     Cervical back: Neck supple.  Lymphadenopathy:     Cervical: No cervical adenopathy.  Skin:    General: Skin is warm and dry.     Findings: No rash.  Neurological:     Mental Status: He is alert and oriented to person, place, and time.     Gait: Gait normal.  Psychiatric:        Mood and Affect: Mood normal.        Behavior: Behavior normal.        Thought Content: Thought content normal.        Judgment: Judgment normal.    UC Treatments / Results  Labs (all labs ordered are listed, but only abnormal results are displayed) Labs Reviewed - No data to display  EKG   Radiology No results found.  Procedures Procedures (including critical care time)  Medications Ordered in UC Medications - No data to display  Initial Impression / Assessment and Plan / UC Course  I have reviewed the triage vital signs and the nursing notes. He declined a neb treatment.  I encouraged him to get back on his BP med as soon as he gets home today and take it a q am to prevent CVA  or MI He missed his PCP apt this week and advised to call them today to make another FU visit.  I placed him on Doxy, Albuterol inhaler to carry  with him, Prednisone, and refilled the albuterol ampule's for his neb machine' Advised to d/c smoking.  Final Clinical Impressions(s) / UC Diagnoses   Final diagnoses:  Bronchitis     Discharge Instructions      Get back on your blood right away Your top number needs to be 140 or less and the bottom 70-80's     ED Prescriptions     Medication Sig Dispense Auth. Provider   doxycycline (VIBRAMYCIN) 100 MG capsule Take 1 capsule (100 mg total) by mouth 2 (two) times daily. 20 capsule Rodriguez-Southworth, Nettie Elm, PA-C   albuterol (VENTOLIN HFA) 108 (90 Base) MCG/ACT inhaler Inhale 2 puffs into the lungs every 4 (four) hours as needed for wheezing or shortness of breath. 18 g Rodriguez-Southworth, Nettie Elm, PA-C   Albuterol Sulfate 2.5 MG/0.5ML NEBU Inhale 0.5 mLs (2.5 mg total) into the lungs every 4 (four) hours. 20 mL Rodriguez-Southworth, Nettie Elm, PA-C   predniSONE (DELTASONE) 20 MG tablet Take 1 tablet (20 mg total) by mouth daily with breakfast. 5 tablet Rodriguez-Southworth, Nettie Elm, PA-C      PDMP not reviewed this encounter.   Garey Ham, [place] 10/10/21 1647

## 2021-10-10 NOTE — ED Triage Notes (Signed)
Patient is here "respiratory infection v/s bronchitis". Symptoms started with "Cough, Congestion" Tuesday evening. No fever.? Sob. No runny nose.

## 2021-10-13 ENCOUNTER — Other Ambulatory Visit: Payer: Self-pay | Admitting: Nurse Practitioner

## 2021-10-13 DIAGNOSIS — I160 Hypertensive urgency: Secondary | ICD-10-CM

## 2021-12-06 ENCOUNTER — Ambulatory Visit
Admission: EM | Admit: 2021-12-06 | Discharge: 2021-12-06 | Disposition: A | Discharge status: Home / Self Care | Payer: Medicaid Other | Attending: Physician Assistant | Admitting: Physician Assistant

## 2021-12-06 DIAGNOSIS — M545 Low back pain, unspecified: Secondary | ICD-10-CM

## 2021-12-06 DIAGNOSIS — I10 Essential (primary) hypertension: Secondary | ICD-10-CM

## 2021-12-06 MED ORDER — TIZANIDINE HCL 4 MG PO TABS: 4.0000 mg | ORAL_TABLET | Freq: Three times a day (TID) | ORAL | 0 refills | Status: AC | PRN

## 2021-12-06 MED ORDER — NAPROXEN 500 MG PO TABS: 500.0000 mg | ORAL_TABLET | Freq: Two times a day (BID) | ORAL | 0 refills | Status: AC

## 2021-12-06 NOTE — ED Triage Notes (Signed)
Pt c/o "pulling something in my lower back" x1day.  ? ?Pt states that he was picking things up while cleaning and was moving weights when pain began.  ? ?Pt states that he is out of Lisinopril and has not taken any BP medication for 3 days.  ?

## 2021-12-06 NOTE — Discharge Instructions (Addendum)
BACK PAIN: Stressed avoiding painful activities . RICE (REST, ICE, COMPRESSION, ELEVATION) guidelines reviewed. May alternate ice and heat. Consider use of muscle rubs, Salonpas patches, etc. Use medications as directed including muscle relaxers if prescribed. Take anti-inflammatory medications as prescribed or OTC NSAIDs/Tylenol.  F/u with PCP in 7-10 days for reexamination, and please feel free to call or return to the urgent care at any time for any questions or concerns you may have and we will be happy to help you!  ? ?BACK PAIN RED FLAGS: If the back pain acutely worsens or there are any red flag symptoms such as numbness/tingling, leg weakness, saddle anesthesia, or loss of bowel/bladder control, go immediately to the ER. Follow up with [place] as scheduled or sooner if the pain does not begin to resolve or if it worsens before the follow up ? ?Your blood pressure is very high.  Do not run out of your medication.  If for some reason you cannot pick up a refill at the pharmacy, please call back and I can send more in for you. ?

## 2021-12-06 NOTE — ED Provider Notes (Signed)
?[facility] ? ? ? ?CSN: [id number] ?Arrival date & time: 12/06/21  1048 ? ? ?  ? ?History   ?Chief Complaint ?Chief Complaint  ?Patient presents with  ? Back Pain  ? ? ?HPI ?Logan Howard is a 46 y.o. male presenting for midline low back pain since earlier today.  Patient says that he was picking up to 45 pound weights while he was sitting on the ground and when he tried to stand up with them he felt immediate pain in his low back.  He says this has happened in the past and he thinks he just pulled something.  No radiation of pain to buttocks or lower extremities.  No associated numbness, tingling or weakness.  No report of loss of bowel or bladder control.  Patient has tried ibuprofen without any improvement in symptoms today.  Patient's blood pressure is elevated at 190/114.  He has a history of hypertension and has been out of his lisinopril for the past 3 days.  States he just needs to go pick it up from the pharmacy.  No other complaints. ? ?HPI ? ?Past Medical History:  ?Diagnosis Date  ? Asthma   ? DVT (deep venous thrombosis) (HCC)   ? Hypertension   ? ? ?There are no problems to display for this patient. ? ? ?Past Surgical History:  ?Procedure Laterality Date  ? NO PAST SURGERIES    ? ? ? ? ? ?Home Medications   ? ?Prior to Admission medications   ?Medication Sig Start Date End Date Taking? Authorizing Provider  ?albuterol (VENTOLIN HFA) 108 (90 Base) MCG/ACT inhaler Inhale 2 puffs into the lungs every 4 (four) hours as needed for wheezing or shortness of breath. 10/10/21  Yes Logan Howard, Logan Elm, PA-C  ?Albuterol Sulfate 2.5 MG/0.5ML NEBU Inhale 0.5 mLs (2.5 mg total) into the lungs every 4 (four) hours. 10/10/21  Yes Logan Howard, Logan Elm, PA-C  ?hydrochlorothiazide (HYDRODIURIL) 12.5 MG tablet Take 1 tablet (12.5 mg total) by mouth daily. 08/16/21  Yes Logan Kuster, NP  ?lisinopril (ZESTRIL) 20 MG tablet TAKE 1 TABLET BY MOUTH EVERY DAY 10/13/21  Yes Logan Howard,  Alyssa, NP  ?naproxen (NAPROSYN) 500 MG tablet Take 1 tablet (500 mg total) by mouth 2 (two) times daily for 15 days. 12/06/21 12/21/21 Yes Logan Howard B, PA-C  ?tiZANidine (ZANAFLEX) 4 MG tablet Take 1 tablet (4 mg total) by mouth every 8 (eight) hours as needed for up to 7 days. 12/06/21 12/13/21 Yes Logan Latch, PA-C  ? ? ?Family History ?Family History  ?Problem Relation Age of Onset  ? Diabetes Mother   ? Hypertension Mother   ? Arthritis Mother   ? Obesity Father   ? COPD Father   ? Arthritis Father   ? Cancer Father   ? Cancer Paternal Aunt   ? Cancer Paternal Uncle   ? ? ?Social History ?Social History  ? ?Tobacco Use  ? Smoking status: Every Day  ?  Packs/day: 2.00  ?  Types: Cigarettes  ? Smokeless tobacco: Never  ?Vaping Use  ? Vaping Use: Never used  ?Substance Use Topics  ? Alcohol use: Yes  ?  Comment: 8 beers per day  ? Drug use: Not Currently  ? ? ? ?Allergies   ?Patient has no known allergies. ? ? ?Review of Systems ?Review of Systems  ?Respiratory:  Negative for shortness of breath.   ?Cardiovascular:  Negative for chest pain.  ?Musculoskeletal:  Positive for back pain. Negative for gait problem.  ?  Neurological:  Negative for dizziness, weakness, numbness and headaches.  ? ? ?Physical Exam ?Triage Vital Signs ?ED Triage Vitals  ?Enc Vitals Group  ?   BP   ?   Pulse   ?   Resp   ?   Temp   ?   Temp src   ?   SpO2   ?   Weight   ?   Height   ?   Head Circumference   ?   Peak Flow   ?   Pain Score   ?   Pain Loc   ?   Pain Edu?   ?   Excl. in GC?   ? ?No data found. ? ?Updated Vital Signs ?BP (!) 190/114 (BP Location: Left Arm)   Pulse 64   Temp 98.2 ?F (36.8 ?C) (Oral)   Resp 18   Ht 6\' 4"  (1.93 m)   Wt (!) 310 lb (140.6 kg)   SpO2 98%   BMI 37.73 kg/m?  ?   ? ?Physical Exam ?Vitals and nursing note reviewed.  ?Constitutional:   ?   General: He is not in acute distress. ?   Appearance: Normal appearance. He is well-developed. He is not ill-appearing.  ?HENT:  ?   Head: Normocephalic and  atraumatic.  ?Eyes:  ?   General: No scleral icterus. ?   Extraocular Movements: Extraocular movements intact.  ?   Conjunctiva/sclera: Conjunctivae normal.  ?   Pupils: Pupils are equal, round, and reactive to light.  ?Cardiovascular:  ?   Rate and Rhythm: Normal rate and regular rhythm.  ?Pulmonary:  ?   Effort: Pulmonary effort is normal. No respiratory distress.  ?   Breath sounds: Normal breath sounds.  ?Musculoskeletal:  ?   Cervical back: Neck supple.  ?   Lumbar back: Tenderness (TTP midline L3-L5) present. No swelling. Normal range of motion (full ROM but has pain when standing back up after full forward flexion). Negative right straight leg raise test and negative left straight leg raise test.  ?     Back: ? ?Skin: ?   General: Skin is warm and dry.  ?   Capillary Refill: Capillary refill takes less than 2 seconds.  ?Neurological:  ?   General: No focal deficit present.  ?   Mental Status: He is alert and oriented to person, place, and time. Mental status is at baseline.  ?   Motor: No weakness.  ?   Coordination: Coordination normal.  ?   Gait: Gait normal.  ?Psychiatric:     ?   Mood and Affect: Mood normal.     ?   Behavior: Behavior normal.     ?   Thought Content: Thought content normal.  ? ? ? ?UC Treatments / Results  ?Labs ?(all labs ordered are listed, but only abnormal results are displayed) ?Labs Reviewed - No data to display ? ?EKG ? ? ?Radiology ?No results found. ? ?Procedures ?Procedures (including critical care time) ? ?Medications Ordered in UC ?Medications - No data to display ? ?Initial Impression / Assessment and Plan / UC Course  ?I have reviewed the triage vital signs and the nursing notes. ? ?Pertinent labs & imaging results that were available during my care of the patient were reviewed by me and considered in my medical decision making (see chart for details). ? ?46 year old male presenting for low back pain following injury that occurred today when he stood up from a seated  position with 90  pounds in free weights.  Reports immediate pain.  No radiation of pain to extremities and no red flag signs or symptoms relating to back pain.  Patient also with blood pressure 190/114.  History of hypertension has not taken his medicine in 3 days.  On exam he does have localized tenderness palpation throughout the lumbar midline region.  Full range of motion of back but does have pain with extension and when standing back up from a fully flexed position.  Negative straight leg raise bilaterally.  We will treat patient at this time with naproxen and tizanidine.  Also encouraged RICE guidelines, heat, lidocaine patches and muscle rubs.  ED precautions were back pain reviewed and handout.  Advised not to run out of blood pressure medication.  Patient advised to call us if he is unable to pick up his medication and I can send more for him. ? ? ?Final Clinical Impressions(s) / UC Diagnoses  ? ?Final diagnoses:  ?Acute midline low back pain without sciatica  ?Essential hypertension  ? ? ? ?Discharge Instructions   ? ?  ?BACK PAIN: Stressed avoiding painful activities . RICE (REST, ICE, COMPRESSION, ELEVATION) guidelines reviewed. May alternate ice and heat. Consider use of muscle rubs, Salonpas patches, etc. Use medications as directed including muscle relaxers if prescribed. Take anti-inflammatory medications as prescribed or OTC NSAIDs/Tylenol.  F/u with PCP in 7-10 days for reexamination, and please feel free to call or return to the urgent care at any time for any questions or concerns you may have and we will be happy to help you!  ? ?BACK PAIN RED FLAGS: If the back pain acutely worsens or there are any red flag symptoms such as numbness/tingling, leg weakness, saddle anesthesia, or loss of bowel/bladder control, go immediately to the ER. Follow up with us as scheduled or sooner if the pain does not begin to resolve or if it worsens before the follow up ? ?Your blood pressure is very high.  Do not  run out of your medication.  If for some reason you cannot pick up a refill at the pharmacy, please call back and I can send more in for you. ? ? ? ? ?ED Prescriptions   ? ? Medication Sig Dispense Auth. Provider  ? naproxe

## 2021-12-28 ENCOUNTER — Other Ambulatory Visit: Payer: Self-pay | Admitting: Nurse Practitioner

## 2021-12-28 DIAGNOSIS — I160 Hypertensive urgency: Secondary | ICD-10-CM

## 2022-02-01 ENCOUNTER — Other Ambulatory Visit: Payer: Self-pay | Admitting: Nurse Practitioner

## 2022-02-01 DIAGNOSIS — I160 Hypertensive urgency: Secondary | ICD-10-CM

## 2022-03-12 ENCOUNTER — Other Ambulatory Visit: Payer: Self-pay

## 2022-03-12 DIAGNOSIS — I160 Hypertensive urgency: Secondary | ICD-10-CM

## 2022-03-12 MED ORDER — LISINOPRIL 20 MG PO TABS: 20.0000 mg | ORAL_TABLET | Freq: Every day | ORAL | 0 refills | Status: DC

## 2022-03-12 NOTE — Telephone Encounter (Signed)
Pt called ask  refills for lisinopril advised him we will send 30 days pres and advised we are not able to refills if no show possible D/C

## 2022-04-14 ENCOUNTER — Encounter: Payer: Self-pay | Admitting: Nurse Practitioner

## 2022-04-14 ENCOUNTER — Ambulatory Visit (INDEPENDENT_AMBULATORY_CARE_PROVIDER_SITE_OTHER): Payer: Medicaid Other | Admitting: Physician Assistant

## 2022-04-14 VITALS — BP 158/92 | HR 66 | Temp 98.5°F | Resp 16 | Ht 76.0 in | Wt 290.4 lb

## 2022-04-14 DIAGNOSIS — I10 Essential (primary) hypertension: Secondary | ICD-10-CM | POA: Diagnosis not present

## 2022-04-14 DIAGNOSIS — E782 Mixed hyperlipidemia: Secondary | ICD-10-CM

## 2022-04-14 DIAGNOSIS — R5383 Other fatigue: Secondary | ICD-10-CM

## 2022-04-14 DIAGNOSIS — R946 Abnormal results of thyroid function studies: Secondary | ICD-10-CM | POA: Diagnosis not present

## 2022-04-14 DIAGNOSIS — I160 Hypertensive urgency: Secondary | ICD-10-CM

## 2022-04-14 MED ORDER — LISINOPRIL 20 MG PO TABS: ORAL_TABLET | ORAL | 0 refills | Status: DC

## 2022-04-14 NOTE — Progress Notes (Signed)
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 One instance did get SOB and took his inhaler and this helped some, but just getting out of the heat made a big difference. Did not pass out. He works outside for long hours. -feels fine if not in the heat, therefore feels he does tolerate the bp medication and really is just the heat. Discussed staying very well hydrated and eating at routine intervals. -He has not been taking hctz and will hold off restarting this given reaction in heat and will check labs -Did drink a redbull today which may be elevating bp further in office. Discussed taking 1.5 tabs of his lisinopril (30mg  total) and monitoring. If not improving may need to titrate up further  Current Medication: Outpatient Encounter Medications as of 04/14/2022  Medication Sig   albuterol (VENTOLIN HFA) 108 (90 Base) MCG/ACT inhaler Inhale 2 puffs into the lungs every 4 (four) hours as needed for wheezing or shortness of breath.   Albuterol Sulfate 2.5 MG/0.5ML NEBU Inhale 0.5 mLs (2.5 mg total) into the lungs every 4 (four) hours.   [DISCONTINUED] lisinopril (ZESTRIL) 20 MG tablet Take 1 tablet (20 mg total) by mouth daily.   hydrochlorothiazide (HYDRODIURIL) 12.5 MG tablet Take 1 tablet (12.5 mg total) by mouth daily.   lisinopril  (ZESTRIL) 20 MG tablet Take 1.5 tabs (30mg ) by mouth daily.   No facility-administered encounter medications on file as of 04/14/2022.    Surgical History: Past Surgical History:  Procedure Laterality Date   NO PAST SURGERIES      Medical History: Past Medical History:  Diagnosis Date   Asthma    DVT (deep venous thrombosis) (HCC)    Hypertension     Family History: Family History  Problem Relation Age of Onset   Diabetes Mother    Hypertension Mother    Arthritis Mother    Obesity Father    COPD Father    Arthritis Father    Cancer Father    Cancer Paternal Aunt    Cancer Paternal Uncle     Social History   Socioeconomic History   Marital status: Single    Spouse name: Not on file   Number of children: Not on file   Years of education: Not on file   Highest education level: Not on file  Occupational History   Not on file  Tobacco Use   Smoking status: Every Day    Packs/day: 2.00    Types: Cigarettes   Smokeless tobacco: Never   Tobacco comments:    1.5 packs daily  Vaping Use   Vaping Use: Never used  Substance and Sexual Activity   Alcohol use: Yes    Comment: 8 beers per day   Drug use: Not  Currently   Sexual activity: Not on file  Other Topics Concern   Not on file  Social History Narrative   Not on file   Social Determinants of Health   Financial Resource Strain: Not on file  Food Insecurity: Not on file  Transportation Needs: Not on file  Physical Activity: Not on file  Stress: Not on file  Social Connections: Not on file  Intimate Partner Violence: Not on file      Review of Systems  Constitutional:  Negative for chills, fatigue and unexpected weight change.  HENT:  Negative for congestion, postnasal drip, rhinorrhea, sneezing and sore throat.   Eyes:  Negative for redness.  Respiratory:  Negative for cough, chest tightness and shortness of breath.   Cardiovascular:  Negative for chest pain and palpitations.  Gastrointestinal:   Negative for abdominal pain, constipation, diarrhea, nausea and vomiting.  Genitourinary:  Negative for dysuria and frequency.  Musculoskeletal:  Negative for arthralgias, back pain, joint swelling and neck pain.  Skin:  Negative for rash.  Neurological:  Negative for tremors and numbness.  Hematological:  Negative for adenopathy. Does not bruise/bleed easily.  Psychiatric/Behavioral:  Negative for behavioral problems (Depression), sleep disturbance and suicidal ideas. The patient is not nervous/anxious.     Vital Signs: BP (!) 158/92 Comment: 164/116  Pulse 66   Temp 98.5 F (36.9 C)   Resp 16   Ht 6\' 4"  (1.93 m)   Wt 290 lb 6.4 oz (131.7 kg)   SpO2 97%   BMI 35.35 kg/m    Physical Exam Vitals and nursing note reviewed.  Constitutional:      General: He is not in acute distress.    Appearance: Normal appearance. He is well-developed. He is obese. He is not diaphoretic.  HENT:     Head: Normocephalic and atraumatic.     Mouth/Throat:     Pharynx: No oropharyngeal exudate.  Eyes:     Pupils: Pupils are equal, round, and reactive to light.  Neck:     Thyroid: No thyromegaly.     Vascular: No JVD.     Trachea: No tracheal deviation.  Cardiovascular:     Rate and Rhythm: Normal rate and regular rhythm.     Heart sounds: Normal heart sounds. No murmur heard.    No friction rub. No gallop.  Pulmonary:     Effort: Pulmonary effort is normal. No respiratory distress.     Breath sounds: No wheezing or rales.  Chest:     Chest wall: No tenderness.  Abdominal:     General: Bowel sounds are normal.     Palpations: Abdomen is soft.  Musculoskeletal:        General: Normal range of motion.     Cervical back: Normal range of motion and neck supple.  Lymphadenopathy:     Cervical: No cervical adenopathy.  Skin:    General: Skin is warm and dry.  Neurological:     Mental Status: He is alert and oriented to person, place, and time.     Cranial Nerves: No cranial nerve deficit.   Psychiatric:        Behavior: Behavior normal.        Thought Content: Thought content normal.        Judgment: Judgment normal.        Assessment/Plan: 1. Essential hypertension Will increase to 30mg  lisinopril and monitor BP. Advised to stay well hydrated and eat regularly, especially if working out in the heat. Contact office if  any problems with dose change or if new or worsening symptoms arise. - lisinopril (ZESTRIL) 20 MG tablet; Take 1.5 tabs (30mg ) by mouth daily.  Dispense: 90 tablet; Refill: 0  2. Mixed hyperlipidemia - Lipid Panel With LDL/HDL Ratio  3. Abnormal thyroid exam - TSH + free T4  4. Other fatigue - CBC w/Diff/Platelet - Comprehensive metabolic panel - Lipid Panel With LDL/HDL Ratio - TSH + free T4   General Counseling: Rey verbalizes understanding of the findings of todays visit and agrees with plan of treatment. I have discussed any further diagnostic evaluation that may be needed or ordered today. We also reviewed his medications today. he has been encouraged to call the office with any questions or concerns that should arise related to todays visit.    Orders Placed This Encounter  Procedures   CBC w/Diff/Platelet   Comprehensive metabolic panel   Lipid Panel With LDL/HDL Ratio   TSH + free T4    Meds ordered this encounter  Medications   lisinopril (ZESTRIL) 20 MG tablet    Sig: Take 1.5 tabs (30mg ) by mouth daily.    Dispense:  90 tablet    Refill:  0    This patient was seen by , PA-C in collaboration with Dr. as a part of collaborative care agreement.   Total time spent:30 Minutes Time spent includes review of chart, medications, test results, and follow up plan with the patient.      Dr Lynn Ito Internal medicine

## 2022-05-14 ENCOUNTER — Encounter: Payer: Medicaid Other | Admitting: Nurse Practitioner

## 2022-06-17 ENCOUNTER — Telehealth: Payer: Self-pay | Admitting: Nurse Practitioner

## 2022-06-17 NOTE — Telephone Encounter (Signed)
Left vm and sent mychart message to confirm 06/24/22 appointment-Toni 

## 2022-06-24 ENCOUNTER — Encounter: Payer: Medicaid Other | Admitting: Nurse Practitioner

## 2022-06-24 ENCOUNTER — Other Ambulatory Visit: Payer: Self-pay | Admitting: Physician Assistant

## 2022-06-24 DIAGNOSIS — I10 Essential (primary) hypertension: Secondary | ICD-10-CM

## 2022-07-18 ENCOUNTER — Ambulatory Visit (INDEPENDENT_AMBULATORY_CARE_PROVIDER_SITE_OTHER): Payer: Medicaid Other

## 2022-07-18 ENCOUNTER — Ambulatory Visit
Admission: EM | Admit: 2022-07-18 | Discharge: 2022-07-18 | Disposition: A | Discharge status: Home / Self Care | Payer: Medicaid Other | Attending: Physician Assistant | Admitting: Physician Assistant

## 2022-07-18 DIAGNOSIS — J209 Acute bronchitis, unspecified: Secondary | ICD-10-CM

## 2022-07-18 DIAGNOSIS — J45901 Unspecified asthma with (acute) exacerbation: Secondary | ICD-10-CM

## 2022-07-18 DIAGNOSIS — R059 Cough, unspecified: Secondary | ICD-10-CM

## 2022-07-18 DIAGNOSIS — R0602 Shortness of breath: Secondary | ICD-10-CM

## 2022-07-18 DIAGNOSIS — R051 Acute cough: Secondary | ICD-10-CM

## 2022-07-18 MED ORDER — PREDNISONE 10 MG PO TABS: ORAL_TABLET | ORAL | 0 refills | Status: DC

## 2022-07-18 MED ORDER — PROMETHAZINE-DM 6.25-15 MG/5ML PO SYRP: 5.0000 mL | ORAL_SOLUTION | Freq: Four times a day (QID) | ORAL | 0 refills | Status: DC | PRN

## 2022-07-18 MED ORDER — DEXAMETHASONE SODIUM PHOSPHATE 10 MG/ML IJ SOLN
10.0000 mg | Freq: Once | INTRAMUSCULAR | Status: AC
  Administered 2022-07-18: 10 mg via INTRAMUSCULAR

## 2022-07-18 MED ORDER — IPRATROPIUM-ALBUTEROL 0.5-2.5 (3) MG/3ML IN SOLN
3.0000 mL | Freq: Once | RESPIRATORY_TRACT | Status: AC
Start: 2022-07-18 — End: 2022-07-18
  Administered 2022-07-18: 3 mL via RESPIRATORY_TRACT

## 2022-07-18 MED ORDER — DOXYCYCLINE HYCLATE 100 MG PO CAPS: 100.0000 mg | ORAL_CAPSULE | Freq: Two times a day (BID) | ORAL | 0 refills | Status: AC

## 2022-07-18 MED ORDER — IPRATROPIUM-ALBUTEROL 0.5-2.5 (3) MG/3ML IN SOLN
3.0000 mL | Freq: Once | RESPIRATORY_TRACT | Status: AC
  Administered 2022-07-18: 3 mL via RESPIRATORY_TRACT

## 2022-07-18 MED ORDER — IPRATROPIUM-ALBUTEROL 0.5-2.5 (3) MG/3ML IN SOLN: 3.0000 mL | RESPIRATORY_TRACT | 0 refills | Status: DC | PRN

## 2022-07-18 NOTE — ED Triage Notes (Signed)
Pt c/o URI x5days  Pt states that he has had nasal congestion, chest tightness, cough, SOB, and wheezing.  Pt states that he has been using albuterol to control the symptoms and it has helped.  When asked if pt wanted a covid test done, pt states he took a home coivd test this morning and states that it was negative.  Pt states that he can not take a good deep breath. Pt oxygen is at 92-93%

## 2022-07-18 NOTE — Discharge Instructions (Signed)
-  Your x-ray does not show pneumonia.  You likely have bronchitis which is most likely due to a virus and this is flared up your asthma.  We have given you a couple breathing treatments in the clinic as well as a steroid shot. - I have sent the same solution to the pharmacy for use in your nebulizer as needed.  Start the corticosteroids tomorrow.  I have also sent an antibiotic to cover you for possible bacterial causes.  Cough medication sent as well.  Plenty of rest and fluids. - If you have any fevers or if your breathing worsens you should go to the emergency department.

## 2022-07-18 NOTE — ED Provider Notes (Signed)
[facility]    CSN: [id number] Arrival date & time: 07/18/22  0849      History   Chief Complaint Chief Complaint  Patient presents with   Cough    HPI Logan Howard is a 46 y.o. male presenting for nasal congestion, cough, chest tightness/shortness of breath and wheezing for the past 5 days.  Patient does have history of asthma.  Reports he has used albuterol at home and it has helped somewhat but he still feels short of breath.  Reports difficulty taking a deep breath.  Reports taking a COVID test this morning and stating it was negative.  He says he took a COVID test a few days ago and that was negative as well.  He denies fever, sore throat, sinus pain, ear pain, abdominal pain, vomiting or diarrhea.  No report of any sick contacts.  Patient has also been taking over-the-counter cough medication.  He is a everyday smoker.  Other medical history includes hypertension and DVT.  HPI  Past Medical History:  Diagnosis Date   Asthma    DVT (deep venous thrombosis) (HCC)    Hypertension     There are no problems to display for this patient.   Past Surgical History:  Procedure Laterality Date   NO PAST SURGERIES         Home Medications    Prior to Admission medications   Medication Sig Start Date End Date Taking? Authorizing Provider  albuterol (VENTOLIN HFA) 108 (90 Base) MCG/ACT inhaler Inhale 2 puffs into the lungs every 4 (four) hours as needed for wheezing or shortness of breath. 10/10/21  Yes Rodriguez-Southworth, Nettie Elm, PA-C  Albuterol Sulfate 2.5 MG/0.5ML NEBU Inhale 0.5 mLs (2.5 mg total) into the lungs every 4 (four) hours. 10/10/21  Yes Rodriguez-Southworth, Nettie Elm, PA-C  doxycycline (VIBRAMYCIN) 100 MG capsule Take 1 capsule (100 mg total) by mouth 2 (two) times daily for 7 days. 07/18/22 07/25/22 Yes Shirlee Latch, PA-C  hydrochlorothiazide (HYDRODIURIL) 12.5 MG tablet Take 1 tablet (12.5 mg total) by mouth daily. 08/16/21  Yes Abernathy,  Alyssa, NP  ipratropium-albuterol (DUONEB) 0.5-2.5 (3) MG/3ML SOLN Take 3 mLs by nebulization every 4 (four) hours as needed. 07/18/22  Yes Eusebio Friendly B, PA-C  lisinopril (ZESTRIL) 20 MG tablet TAKE 1.5 TABS (30MG ) BY MOUTH DAILY. 06/24/22  Yes Abernathy, 06/26/22, NP  predniSONE (DELTASONE) 10 MG tablet Take 5 tabs x 2 days, 4 tabs x 2 days, 3 tabs x 2 days, 2 tabs x 2 days, 1 tab x 2 days 07/18/22  Yes 07/20/22, PA-C  promethazine-dextromethorphan (PROMETHAZINE-DM) 6.25-15 MG/5ML syrup Take 5 mLs by mouth 4 (four) times daily as needed. 07/18/22  Yes 07/20/22, PA-C    Family History Family History  Problem Relation Age of Onset   Diabetes Mother    Hypertension Mother    Arthritis Mother    Obesity Father    COPD Father    Arthritis Father    Cancer Father    Cancer Paternal Aunt    Cancer Paternal Uncle     Social History Social History   Tobacco Use   Smoking status: Every Day    Packs/day: 2.00    Types: Cigarettes   Smokeless tobacco: Never   Tobacco comments:    1.5 packs daily  Vaping Use   Vaping Use: Never used  Substance Use Topics   Alcohol use: Not Currently    Comment: 8 beers per day   Drug use: Not  Currently     Allergies   Patient has no known allergies.   Review of Systems Review of Systems  Constitutional:  Positive for fatigue. Negative for fever.  HENT:  Positive for congestion and rhinorrhea. Negative for sinus pressure, sinus pain and sore throat.   Respiratory:  Positive for cough, shortness of breath and wheezing.   Cardiovascular:  Negative for chest pain.  Gastrointestinal:  Negative for abdominal pain, diarrhea, nausea and vomiting.  Musculoskeletal:  Negative for myalgias.  Neurological:  Negative for weakness, light-headedness and headaches.  Hematological:  Negative for adenopathy.     Physical Exam Triage Vital Signs ED Triage Vitals  Enc Vitals Group     BP      Pulse      Resp      Temp      Temp src       SpO2      Weight      Height      Head Circumference      Peak Flow      Pain Score      Pain Loc      Pain Edu?      Excl. in GC?    No data found.  Updated Vital Signs BP (!) 165/96 (BP Location: Left Arm)   Pulse 85   Temp 98.2 F (36.8 C) (Oral)   Resp 16   Ht 6\' 4"  (1.93 m)   Wt 285 lb (129.3 kg)   SpO2 92%   BMI 34.69 kg/m    Physical Exam Vitals and nursing note reviewed.  Constitutional:      General: He is not in acute distress.    Appearance: Normal appearance. He is well-developed. He is not ill-appearing.  HENT:     Head: Normocephalic and atraumatic.     Nose: Congestion present.     Mouth/Throat:     Mouth: Mucous membranes are moist.     Pharynx: Oropharynx is clear.  Eyes:     General: No scleral icterus.    Conjunctiva/sclera: Conjunctivae normal.  Cardiovascular:     Rate and Rhythm: Normal rate and regular rhythm.     Heart sounds: Normal heart sounds.  Pulmonary:     Effort: Pulmonary effort is normal. No respiratory distress.     Breath sounds: Wheezing and rhonchi present. No rales.  Musculoskeletal:     Cervical back: Neck supple.  Skin:    General: Skin is warm and dry.     Capillary Refill: Capillary refill takes less than 2 seconds.  Neurological:     General: No focal deficit present.     Mental Status: He is alert. Mental status is at baseline.     Motor: No weakness.     Gait: Gait normal.  Psychiatric:        Mood and Affect: Mood normal.        Behavior: Behavior normal.      UC Treatments / Results  Labs (all labs ordered are listed, but only abnormal results are displayed) Labs Reviewed - No data to display  EKG   Radiology DG Chest 2 View  Result Date: 07/18/2022 CLINICAL DATA:  46 year old male with cough and shortness of breath for 5 days. Congestion. Smoker. EXAM: CHEST - 2 VIEW COMPARISON:  Chest radiographs 04/25/2020 and earlier. FINDINGS: PA and lateral views at 0935 hours. Stable lung volumes. Normal  cardiac size and mediastinal contours. Chronic increased somewhat coarse bilateral pulmonary interstitial markings are stable since  2017. Visualized tracheal air column is within normal limits. No pneumothorax, pulmonary edema, pleural effusion or acute pulmonary opacity. Negative visible bowel gas and osseous structures. 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 No acute cardiopulmonary abnormality. Electronically Signed   By: Odessa Fleming M.D.   On: 07/18/2022 09:51    Procedures Procedures (including critical care time)  Medications Ordered in UC Medications  ipratropium-albuterol (DUONEB) 0.5-2.5 (3) MG/3ML nebulizer solution 3 mL (3 mLs Nebulization Given 07/18/22 0916)  ipratropium-albuterol (DUONEB) 0.5-2.5 (3) MG/3ML nebulizer solution 3 mL (3 mLs Nebulization Given 07/18/22 1010)  dexamethasone (DECADRON) injection 10 mg (10 mg Intramuscular Given 07/18/22 1029)    Initial Impression / Assessment and Plan / UC Course  I have reviewed the triage vital signs and the nursing notes.  Pertinent labs & imaging results that were available during my care of the patient were reviewed by me and considered in my medical decision making (see chart for details).   47 year old smoker with history of asthma and hypertension presents for cough, congestion wheezing and shortness of breath x 5 days.  BP elevated at 165/96.  Oxygen is 92%.  Other vitals normal and stable.  Ordered DuoNeb and chest x-ray.  Chest x-ray without evidence of pneumonia.  It does show chronic pulmonary interstitial changes which radiologist believes to be smoking-related.  He does have history of asthma.  Question possible underlying COPD.  Also question possible sleep apnea as he reports that he snores a lot.  His tonsils are large.  Advised him to follow-up with his PCP to request a sleep study to be evaluated for obstructive sleep apnea.  We discussed the risks of not being treated for sleep  apnea if he does have that including heart disease and worsening lung disease.  Suspect mild COPD exacerbation/asthma exacerbation.  Likely viral but given his low oxygen and history of possible COPD, will cover him with antibiotics.  Sent doxycycline to pharmacy.  He was given 2 DuoNeb treatments.  Also given 10 mg IM dexamethasone.  Additionally sent prednisone to the pharmacy for him to start tomorrow and the DuoNeb solution as well as Promethazine DM.  Encouraged plenty rest and fluids.  We discussed going to the emergency department if he has any fevers or if he feels that his breathing is worsening, otherwise he knows to follow-up with his PCP.   Final Clinical Impressions(s) / UC Diagnoses   Final diagnoses:  Acute bronchitis, unspecified organism  Exacerbation of asthma, unspecified asthma severity, unspecified whether persistent  Acute cough  Shortness of breath     Discharge Instructions      -Your x-ray does not show pneumonia.  You likely have bronchitis which is most likely due to a virus and this is flared up your asthma.  We have given you a couple breathing treatments in the clinic as well as a steroid shot. - I have sent the same solution to the pharmacy for use in your nebulizer as needed.  Start the corticosteroids tomorrow.  I have also sent an antibiotic to cover you for possible bacterial causes.  Cough medication sent as well.  Plenty of rest and fluids. - If you have any fevers or if your breathing worsens you should go to the emergency department.     ED Prescriptions     Medication Sig Dispense Auth. Provider   predniSONE (DELTASONE) 10 MG tablet Take 5 tabs x 2 days, 4 tabs x 2 days, 3 tabs x 2 days, 2 tabs  x 2 days, 1 tab x 2 days 30 tablet Eusebio Friendly B, PA-C   doxycycline (VIBRAMYCIN) 100 MG capsule Take 1 capsule (100 mg total) by mouth 2 (two) times daily for 7 days. 14 capsule Shirlee Latch, PA-C   promethazine-dextromethorphan (PROMETHAZINE-DM)  6.25-15 MG/5ML syrup Take 5 mLs by mouth 4 (four) times daily as needed. 118 mL Eusebio Friendly B, PA-C   ipratropium-albuterol (DUONEB) 0.5-2.5 (3) MG/3ML SOLN Take 3 mLs by nebulization every 4 (four) hours as needed. 360 mL Shirlee Latch, PA-C      PDMP not reviewed this encounter.   Shirlee Latch, PA-C 07/18/22 1225

## 2023-04-21 ENCOUNTER — Emergency Department: Payer: Medicaid Other

## 2023-04-21 ENCOUNTER — Encounter: Payer: Self-pay | Admitting: Emergency Medicine

## 2023-04-21 ENCOUNTER — Other Ambulatory Visit: Payer: Self-pay

## 2023-04-21 ENCOUNTER — Emergency Department
Admission: EM | Admit: 2023-04-21 | Discharge: 2023-04-21 | Disposition: A | Discharge status: Home / Self Care | Payer: Medicaid Other | Attending: Student in an Organized Health Care Education/Training Program | Admitting: Student in an Organized Health Care Education/Training Program

## 2023-04-21 DIAGNOSIS — R2242 Localized swelling, mass and lump, left lower limb: Secondary | ICD-10-CM | POA: Insufficient documentation

## 2023-04-21 DIAGNOSIS — R200 Anesthesia of skin: Secondary | ICD-10-CM | POA: Diagnosis not present

## 2023-04-21 LAB — URINALYSIS, ROUTINE W REFLEX MICROSCOPIC
Bilirubin Urine: NEGATIVE
Glucose, UA: NEGATIVE mg/dL
Hgb urine dipstick: NEGATIVE
Ketones, ur: NEGATIVE mg/dL
Leukocytes,Ua: NEGATIVE
Nitrite: NEGATIVE
Protein, ur: NEGATIVE mg/dL
Specific Gravity, Urine: 1.023 (ref 1.005–1.030)
pH: 5 (ref 5.0–8.0)

## 2023-04-21 LAB — CBC
HCT: 40.7 % (ref 39.0–52.0)
Hemoglobin: 13.2 g/dL (ref 13.0–17.0)
MCH: 31.4 pg (ref 26.0–34.0)
MCHC: 32.4 g/dL (ref 30.0–36.0)
MCV: 96.7 fL (ref 80.0–100.0)
Platelets: 219 10*3/uL (ref 150–400)
RBC: 4.21 MIL/uL — ABNORMAL LOW (ref 4.22–5.81)
RDW: 12.4 % (ref 11.5–15.5)
WBC: 8.5 10*3/uL (ref 4.0–10.5)
nRBC: 0 % (ref 0.0–0.2)

## 2023-04-21 LAB — BASIC METABOLIC PANEL
Anion gap: 5 (ref 5–15)
BUN: 20 mg/dL (ref 6–20)
CO2: 26 mmol/L (ref 22–32)
Calcium: 8.6 mg/dL — ABNORMAL LOW (ref 8.9–10.3)
Chloride: 104 mmol/L (ref 98–111)
Creatinine, Ser: 1.21 mg/dL (ref 0.61–1.24)
GFR, Estimated: >60 mL/min (ref >60)
Glucose, Bld: 90 mg/dL (ref 70–99)
Potassium: 3.8 mmol/L (ref 3.5–5.1)
Sodium: 135 mmol/L (ref 135–145)

## 2023-04-21 NOTE — ED Triage Notes (Signed)
Pt here with left foot and leg numbness and pain. Pt states pain was along the back of his calk. Pt has a hx of a blood clot when he was in his 20s. Pt states he was dizzy yesterday and a little this morning but states it has resolved. Pt ambulatory to triage.

## 2023-04-21 NOTE — ED Provider Notes (Signed)
[facility] Provider Note    Event Date/Time   First MD Initiated Contact with Patient 04/21/23 1409     (approximate)   History   Numbness   HPI  Logan Howard is a 47 y.o. male who presents to the ER for evaluation of left leg swelling discomfort and episode of numbness.  Has had a history of DVT in that leg previously not currently on any anticoagulation.  Denies any specific injury.  Does have significant family history of diabetes was concerned he may have component of neuropathy developing but he is never been diagnosed with diabetes personally.  Denies any specific injury.  Denies any other associated numbness or tingling.  No back pain.  No loss of bowel or bladder control no saddle anesthesia.     Physical Exam   Triage Vital Signs: ED Triage Vitals  Encounter Vitals Group     BP 04/21/23 1338 (!) 148/99     Systolic BP Percentile --      Diastolic BP Percentile --      Pulse Rate 04/21/23 1338 63     Resp 04/21/23 1338 20     Temp 04/21/23 1343 98.4 F (36.9 C)     Temp Source 04/21/23 1343 Oral     SpO2 04/21/23 1338 98 %     Weight 04/21/23 1340 285 lb 0.9 oz (129.3 kg)     Height 04/21/23 1340 6\' 4"  (1.93 m)     Head Circumference --      Peak Flow --      Pain Score 04/21/23 1339 7     Pain Loc --      Pain Education --      Exclude from Growth Chart --     Most recent vital signs: Vitals:   04/21/23 1338 04/21/23 1343  BP: (!) 148/99   Pulse: 63   Resp: 20   Temp:  98.4 F (36.9 C)  SpO2: 98%      Constitutional: Alert  Eyes: Conjunctivae are normal.  Head: Atraumatic. Nose: No congestion/rhinnorhea. Mouth/Throat: Mucous membranes are moist.   Neck: Painless ROM.  Cardiovascular:   Good peripheral circulation. Respiratory: Normal respiratory effort.  No retractions.  Gastrointestinal: Soft and nontender.  Musculoskeletal:  no deformity there is left greater than right lower extremity edema.  Slight  subjective decrease in sensation to light touch on the dorsal aspect of the left foot but light touch and sensation grossly intact.  Motor strength intact.  Compartment soft.  Good peripheral pulses. Neurologic:  MAE spontaneously. No gross focal neurologic deficits are appreciated.  Skin:  Skin is warm, dry and intact. No rash noted. Psychiatric: Mood and affect are normal. Speech and behavior are normal.    ED Results / Procedures / Treatments   Labs (all labs ordered are listed, but only abnormal results are displayed) Labs Reviewed  BASIC METABOLIC PANEL - Abnormal; Notable for the following components:      Result Value   Calcium 8.6 (*)    All other components within normal limits  CBC - Abnormal; Notable for the following components:   RBC 4.21 (*)    All other components within normal limits  URINALYSIS, ROUTINE W REFLEX MICROSCOPIC - Abnormal; Notable for the following components:   Color, Urine YELLOW (*)    APPearance CLEAR (*)    All other components within normal limits  CBG MONITORING, ED     EKG     RADIOLOGY Please see  ED Course for my review and interpretation.  I personally reviewed all radiographic images ordered to evaluate for the above acute complaints and reviewed radiology reports and findings.  These findings were personally discussed with the patient.  Please see medical record for radiology report.    PROCEDURES:  Critical Care performed: No  Procedures   MEDICATIONS ORDERED IN ED: Medications - No data to display   IMPRESSION / MDM / ASSESSMENT AND PLAN / ED COURSE  I reviewed the triage vital signs and the nursing notes.                              Differential diagnosis includes, but is not limited to, DVT, radiculopathy, sciatica, neuropathy  Patient presenting to the ER for evaluation of symptoms as described above.  Based on symptoms, risk factors and considered above differential, this presenting complaint could reflect a  potentially life-threatening illness therefore the patient will be placed on continuous pulse oximetry and telemetry for monitoring.  Laboratory evaluation will be sent to evaluate for the above complaints.  Blood work is reassuring.  No objective focal neurodeficits.  Not consistent with CVA.  Possible DVT given swelling and history.  Will check venous ultrasound.  Ultrasound without evidence of DVT.  Patient is clinically stable and appropriate for outpatient follow-up.  We discussed signs and symptoms for which he should return to the ER.     FINAL CLINICAL IMPRESSION(S) / ED DIAGNOSES   Final diagnoses:  Localized swelling of left lower leg     Rx / DC Orders   ED Discharge Orders     None        Note:  This document was prepared using Dragon voice recognition software and may include unintentional dictation errors.    Willy Eddy, MD 04/21/23 1520

## 2023-04-21 NOTE — ED Notes (Signed)
See triage note  Presents with some pain to posterior left lower leg  States pain started yesterday    some swelling  Denies any injury  Ambulates well  Hx of blood clots in past

## 2024-08-11 ENCOUNTER — Ambulatory Visit
Admission: EM | Admit: 2024-08-11 | Discharge: 2024-08-11 | Disposition: A | Discharge status: Home / Self Care | Source: Home / Self Care

## 2024-08-11 DIAGNOSIS — I10 Essential (primary) hypertension: Secondary | ICD-10-CM

## 2024-08-11 DIAGNOSIS — K047 Periapical abscess without sinus: Secondary | ICD-10-CM

## 2024-08-11 DIAGNOSIS — I160 Hypertensive urgency: Secondary | ICD-10-CM

## 2024-08-11 MED ORDER — AMOXICILLIN-POT CLAVULANATE 875-125 MG PO TABS: 1.0000 | ORAL_TABLET | Freq: Two times a day (BID) | ORAL | 0 refills | Status: AC

## 2024-08-11 MED ORDER — HYDROCHLOROTHIAZIDE 12.5 MG PO TABS: 12.5000 mg | ORAL_TABLET | Freq: Every day | ORAL | 1 refills | Status: AC

## 2024-08-11 MED ORDER — LISINOPRIL 20 MG PO TABS: ORAL_TABLET | ORAL | 1 refills | Status: AC

## 2024-08-11 MED ORDER — ALBUTEROL SULFATE HFA 108 (90 BASE) MCG/ACT IN AERS: 2.0000 | INHALATION_SPRAY | RESPIRATORY_TRACT | 0 refills | Status: AC | PRN

## 2024-08-11 NOTE — Discharge Instructions (Addendum)
 As we discussed, your blood pressure is elevated and you need to resume your antihypertensives.  I have refilled your lisinopril  30 mg daily along with your hydrochlorothiazide  12.5 mg daily for management of your blood pressure.  Obtain a blood pressure cuff and measure your blood pressure at home periodically.  Record these values in the form in your discharge instructions so she can take them with you to your new patient appointment.  We need to take your blood pressure make sure that you have not smoked, consumes caffeine, you have been sitting in a restful state for at least 30 minutes, your bladder is emptying, you are sitting with your feet flat on the floor, and the arm you are taking the reading and is at heart level for at least 5 minutes.  Take the reading and record it.  I have a suggest that you stop smoking as this will greatly decrease your blood pressure.  I have also refilled your albuterol  inhaler and you can do 1 or 2 puffs every 4-6 hours as needed for any shortness of breath or wheezing.  Take the Augmentin  twice daily with food for 7 days for treatment of your dental infection.  Use over-the-counter Tylenol  and ibuprofen  for swelling and mild to moderate pain.  Rinse with warm salt water, or Listerine, after each meal to remove food particles and wash away any pus that is collecting.  If you develop any increasing or swelling, fever, pain, or difficulty swallowing you to go to the emergency department at [facility] with a have an oral surgeon and also a dentist on-call.   If you develop any headache, dizziness, change in vision, chest pain, sweating, increasing shortness of breath you need to call 911 and go to the ER for evaluation.

## 2024-08-11 NOTE — ED Provider Notes (Signed)
 [facility]    CSN: [id number] Arrival date & time: 08/11/24  0944      History   Chief Complaint Chief Complaint  Patient presents with   Dental Pain    HPI Logan Howard is a 48 y.o. male.   HPI  48 year old male with past medical history significant for asthma, COPD, DVT, hypertension presents for evaluation of right upper dental pain that began yesterday.  He reports that he took 800 mg of ibuprofen  this morning at 7 AM for the pain.  He denies any fever or drainage.  Patient was found to be hypertensive with a blood pressure of 231/132.  He is denying any headache, dizziness, change in vision, chest pain, or shortness of breath.  Patient is a smoker and he did smoke just before he came into the clinic.  Past Medical History:  Diagnosis Date   Asthma    DVT (deep venous thrombosis) (HCC)    Hypertension     Patient Active Problem List   Diagnosis Date Noted   Primary osteoarthritis of right knee 11/16/2014   Complex tear of medial meniscus of left knee as current injury 11/02/2014    Past Surgical History:  Procedure Laterality Date   NO PAST SURGERIES         Home Medications    Prior to Admission medications  Medication Sig Start Date End Date Taking? Authorizing Provider  amoxicillin -clavulanate (AUGMENTIN ) 875-125 MG tablet Take 1 tablet by mouth every 12 (twelve) hours for 7 days. 08/11/24 08/18/24 Yes Bernardino Ditch, NP  albuterol  (VENTOLIN  HFA) 108 (90 Base) MCG/ACT inhaler Inhale 2 puffs into the lungs every 4 (four) hours as needed for wheezing or shortness of breath. 08/11/24   Bernardino Ditch, NP  hydrochlorothiazide  (HYDRODIURIL ) 12.5 MG tablet Take 1 tablet (12.5 mg total) by mouth daily. 08/11/24   Bernardino Ditch, NP  lisinopril  (ZESTRIL ) 20 MG tablet Take 1.5 tabs (30mg ) by mouth daily. 08/11/24   Bernardino Ditch, NP    Family History Family History  Problem Relation Age of Onset   Diabetes Mother    Hypertension Mother     Arthritis Mother    Obesity Father    COPD Father    Arthritis Father    Cancer Father    Cancer Paternal Aunt    Cancer Paternal Uncle     Social History Social History[1]   Allergies   Patient has no known allergies.   Review of Systems Review of Systems  Constitutional:  Negative for fever.  HENT:  Positive for dental problem. Negative for facial swelling.   Eyes:  Negative for visual disturbance.  Respiratory:  Negative for shortness of breath.   Cardiovascular:  Negative for chest pain.  Neurological:  Negative for dizziness and headaches.     Physical Exam Triage Vital Signs ED Triage Vitals  Encounter Vitals Group     BP      Girls Systolic BP Percentile      Girls Diastolic BP Percentile      Boys Systolic BP Percentile      Boys Diastolic BP Percentile      Pulse      Resp      Temp      Temp src      SpO2      Weight      Height      Head Circumference      Peak Flow      Pain Score  Pain Loc      Pain Education      Exclude from Growth Chart    No data found.  Updated Vital Signs BP (!) 190/110 (BP Location: Right Arm)   Pulse 72   Temp 98.6 F (37 C) (Oral)   Resp 18   SpO2 97%   Visual Acuity Right Eye Distance:   Left Eye Distance:   Bilateral Distance:    Right Eye Near:   Left Eye Near:    Bilateral Near:     Physical Exam Vitals and nursing note reviewed.  Constitutional:      Appearance: Normal appearance. He is not ill-appearing.  HENT:     Head: Normocephalic and atraumatic.     Mouth/Throat:     Pharynx: Posterior oropharyngeal erythema present. No oropharyngeal exudate.     Comments: Significant dental decay to all of the teeth.  There is some mild erythema but no edema or exudate noted from the gum tissue on the upper right. Cardiovascular:     Rate and Rhythm: Normal rate and regular rhythm.     Pulses: Normal pulses.     Heart sounds: Normal heart sounds. No murmur heard.    No friction rub. No gallop.   Pulmonary:     Effort: Pulmonary effort is normal.     Breath sounds: Normal breath sounds. No wheezing, rhonchi or rales.  Skin:    General: Skin is warm and dry.     Capillary Refill: Capillary refill takes less than 2 seconds.     Findings: No rash.  Neurological:     General: No focal deficit present.     Mental Status: He is alert and oriented to person, place, and time.      UC Treatments / Results  Labs (all labs ordered are listed, but only abnormal results are displayed) Labs Reviewed - No data to display  EKG Sinus bradycardia with a ventricular to 59 bpm PR interval 188 ms QRS duration 98 ms QT/QTc 464/459 ms No ST or T wave abnormalities noted.  Radiology No results found.  Procedures Procedures (including critical care time)  Medications Ordered in UC Medications - No data to display  Initial Impression / Assessment and Plan / UC Course  I have reviewed the triage vital signs and the nursing notes.  Pertinent labs & imaging results that were available during my care of the patient were reviewed by me and considered in my medical decision making (see chart for details).   Patient is a pleasant 48 year old male presenting for evaluation of right upper abdominal pain that started yesterday.  He has got very poor dentition and the majority of the teeth are broken off at the gumline throughout his mouth.  There is mild edema and erythema of the right upper gum tissue but no visible exudate.  The patient is also hypertensive.  Initial reading was 231/132.  Repeat manual blood pressure came to 190/110.  The patient was previously prescribed 30 mg of lisinopril  and 12.5 mg of hydrochlorothiazide  daily.  He reports that he was going to a clinic but states he really did not have a primary care provider.  He missed an appointment and they stop prescribing his antihypertensives.  He reports that he has not taking them in the last year and a half.  He is not experiencing any  cardiovascular or neurologic symptoms at this time.  I will have staff obtain EKG to evaluate for any evidence of heart strain.  Patient  was seen in urgent care on 05/08/2024 at [facility] and at that time his blood pressure was normal at 125/80.  Several weeks prior he was seen at [facility] ER for left leg swelling and his blood pressure at that time was 148/99.  I suspect that the patient's markedly elevated blood pressure today is a combination of the fact that he just smoked prior to coming into the clinic coupled with his ibuprofen  ingestion for control of pain.  If his EKG does not show any evidence of heart strain I will restart him on his antihypertensives, have him establish care with a PCP, and have given him ER precautions.  If he has any abnormalities on his EKG I will refer him to the ER presently.  Patient is EKG shows sinus bradycardia without any ST or T wave abnormalities noted.  No appreciable change when compared to EKG from 04/21/2023.  Patient had normal renal function on BMP from 04/21/2023.  I will restart him on his lisinopril  and hydrochlorothiazide  as well as Augmentin  875 mg twice daily with food for 7 days for treatment of his dental infection.  If he develops any cardiac or neurovascular symptoms I have directed him to go to the ER for evaluation.   Final Clinical Impressions(s) / UC Diagnoses   Final diagnoses:  Essential hypertension  Dental infection     Discharge Instructions      As we discussed, your blood pressure is elevated and you need to resume your antihypertensives.  I have refilled your lisinopril  30 mg daily along with your hydrochlorothiazide  12.5 mg daily for management of your blood pressure.  Obtain a blood pressure cuff and measure your blood pressure at home periodically.  Record these values in the form in your discharge instructions so she can take them with you to your new patient appointment.  We need to take your blood pressure make sure  that you have not smoked, consumes caffeine, you have been sitting in a restful state for at least 30 minutes, your bladder is emptying, you are sitting with your feet flat on the floor, and the arm you are taking the reading and is at heart level for at least 5 minutes.  Take the reading and record it.  I have a suggest that you stop smoking as this will greatly decrease your blood pressure.  I have also refilled your albuterol  inhaler and you can do 1 or 2 puffs every 4-6 hours as needed for any shortness of breath or wheezing.  Take the Augmentin  twice daily with food for 7 days for treatment of your dental infection.  Use over-the-counter Tylenol  and ibuprofen  for swelling and mild to moderate pain.  Rinse with warm salt water, or Listerine, after each meal to remove food particles and wash away any pus that is collecting.  If you develop any increasing or swelling, fever, pain, or difficulty swallowing you to go to the emergency department at [facility] with a have an oral surgeon and also a dentist on-call.   If you develop any headache, dizziness, change in vision, chest pain, sweating, increasing shortness of breath you need to call 911 and go to the ER for evaluation.     ED Prescriptions     Medication Sig Dispense Auth. Provider   amoxicillin -clavulanate (AUGMENTIN ) 875-125 MG tablet Take 1 tablet by mouth every 12 (twelve) hours for 7 days. 14 tablet Bernardino Ditch, NP   lisinopril  (ZESTRIL ) 20 MG tablet Take 1.5 tabs (30mg ) by  mouth daily. 45 tablet Bernardino Ditch, NP   hydrochlorothiazide  (HYDRODIURIL ) 12.5 MG tablet Take 1 tablet (12.5 mg total) by mouth daily. 30 tablet Bernardino Ditch, NP   albuterol  (VENTOLIN  HFA) 108 (90 Base) MCG/ACT inhaler Inhale 2 puffs into the lungs every 4 (four) hours as needed for wheezing or shortness of breath. 18 g Bernardino Ditch, NP      PDMP not reviewed this encounter.     [1]  Social History Tobacco Use   Smoking status: Every Day     Current packs/day: 2.00    Types: Cigarettes   Smokeless tobacco: Never   Tobacco comments:    1.5 packs daily  Vaping Use   Vaping status: Never Used  Substance Use Topics   Alcohol use: Not Currently    Comment: 8 beers per day   Drug use: Not Currently     Bernardino Ditch, NP 08/11/24 1103

## 2024-08-11 NOTE — ED Notes (Signed)
 Patient reports not taking his BP medications for 1.5 years, states he was on lisinopril . No PCP.

## 2024-08-11 NOTE — ED Triage Notes (Signed)
 Patient presents to [facility] for right upper dental pain since yesterday. Took 800 mg ibuprofen  today at 0700.

## 2024-08-13 ENCOUNTER — Other Ambulatory Visit: Payer: Self-pay

## 2024-08-13 ENCOUNTER — Emergency Department

## 2024-08-13 ENCOUNTER — Emergency Department
Admission: EM | Admit: 2024-08-13 | Discharge: 2024-08-13 | Discharge status: Against Medical Advice | Attending: Emergency Medicine | Admitting: Emergency Medicine

## 2024-08-13 DIAGNOSIS — F17290 Nicotine dependence, other tobacco product, uncomplicated: Secondary | ICD-10-CM | POA: Diagnosis not present

## 2024-08-13 DIAGNOSIS — J449 Chronic obstructive pulmonary disease, unspecified: Secondary | ICD-10-CM | POA: Insufficient documentation

## 2024-08-13 DIAGNOSIS — Z5321 Procedure and treatment not carried out due to patient leaving prior to being seen by health care provider: Secondary | ICD-10-CM | POA: Insufficient documentation

## 2024-08-13 DIAGNOSIS — R0602 Shortness of breath: Secondary | ICD-10-CM | POA: Diagnosis present

## 2024-08-13 DIAGNOSIS — R200 Anesthesia of skin: Secondary | ICD-10-CM | POA: Diagnosis not present

## 2024-08-13 DIAGNOSIS — I10 Essential (primary) hypertension: Secondary | ICD-10-CM | POA: Insufficient documentation

## 2024-08-13 LAB — CBC
HCT: 42.8 % (ref 39.0–52.0)
Hemoglobin: 14.2 g/dL (ref 13.0–17.0)
MCH: 30.7 pg (ref 26.0–34.0)
MCHC: 33.2 g/dL (ref 30.0–36.0)
MCV: 92.4 fL (ref 80.0–100.0)
Platelets: 288 K/uL (ref 150–400)
RBC: 4.63 MIL/uL (ref 4.22–5.81)
RDW: 11.9 % (ref 11.5–15.5)
WBC: 13.9 K/uL — ABNORMAL HIGH (ref 4.0–10.5)
nRBC: 0 % (ref 0.0–0.2)

## 2024-08-13 LAB — BASIC METABOLIC PANEL WITH GFR
Anion gap: 14 (ref 5–15)
BUN: 20 mg/dL (ref 6–20)
CO2: 27 mmol/L (ref 22–32)
Calcium: 9.4 mg/dL (ref 8.9–10.3)
Chloride: 100 mmol/L (ref 98–111)
Creatinine, Ser: 1.59 mg/dL — ABNORMAL HIGH (ref 0.61–1.24)
GFR, Estimated: 54 mL/min — ABNORMAL LOW
Glucose, Bld: 85 mg/dL (ref 70–99)
Potassium: 4.1 mmol/L (ref 3.5–5.1)
Sodium: 141 mmol/L (ref 135–145)

## 2024-08-13 NOTE — ED Notes (Signed)
 Pt not in bathroom. Pt not outside.

## 2024-08-13 NOTE — ED Notes (Signed)
Pt called for room assignment no answer 

## 2024-08-13 NOTE — ED Triage Notes (Signed)
 Pt presents for shortness of breath starting approx 4 hrs ago. Recently seen for hypertension (systolic 220s) and told if this happens again to come to the ED. Pt states when this started, his chest was tight and I was having to work hard to get air but I am a smoker and I think I have COPD. But everything seem like it had a yellow tint like I had yellow sunglasses on.  All symptoms resolved at this time. Not hypertensive (started on multiple HTN medications this week- taking all as prescribed).

## 2024-08-13 NOTE — ED Notes (Signed)
No answer for repeat VS

## 2024-08-23 ENCOUNTER — Ambulatory Visit: Admitting: Family Medicine

## 2024-08-23 ENCOUNTER — Encounter: Payer: Self-pay | Admitting: Family Medicine

## 2024-08-23 VITALS — BP 135/87 | HR 95 | Resp 16 | Ht 76.0 in | Wt 280.0 lb

## 2024-08-23 DIAGNOSIS — J4489 Other specified chronic obstructive pulmonary disease: Secondary | ICD-10-CM | POA: Diagnosis not present

## 2024-08-23 DIAGNOSIS — I10 Essential (primary) hypertension: Secondary | ICD-10-CM

## 2024-08-23 DIAGNOSIS — Z7689 Persons encountering health services in other specified circumstances: Secondary | ICD-10-CM | POA: Diagnosis not present

## 2024-08-23 DIAGNOSIS — F5101 Primary insomnia: Secondary | ICD-10-CM

## 2024-08-23 MED ORDER — ADVAIR DISKUS 250-50 MCG/ACT IN AEPB: 1.0000 | INHALATION_SPRAY | Freq: Two times a day (BID) | RESPIRATORY_TRACT | 11 refills | Status: AC

## 2024-08-23 NOTE — Patient Instructions (Addendum)
 Smoking Cessation: QuitlineNC 1-800-QUIT-NOW 252-294-5456); Espaol: 1-855-Djelo-Ya (8-144-664-6430) http://carroll-castaneda.info/       MyChart:  For all urgent or time sensitive needs we ask that you please call the office to avoid delays. Our number is [phone]) [phone]. MyChart is not constantly monitored and due to the large volume of messages a day, replies may take up to 72 business hours.   MyChart Policy: MyChart allows for you to see your visit notes, after visit summary, provider recommendations, lab and tests results, make an appointment, request refills, and contact your provider or the office for non-urgent questions or concerns. Providers are seeing patients during normal business hours and do not have built in time to review MyChart messages.  We ask that you allow a minimum of 3 business days for responses to [employer]. For this reason, please do not send urgent requests through MyChart. Please call the office at [phone]. New and ongoing conditions may require a visit. We have virtual and in person visit available for your convenience.  Complex MyChart concerns may require a visit. Your provider may request you schedule a virtual or in person visit to ensure we are providing the best care possible. MyChart messages sent after 11:00 AM on Friday will not be received by the provider until Monday morning.    Lab and Test Results: You will receive your lab and test results on MyChart as soon as they are completed and results have been sent by the lab or testing facility. Due to this service, you will receive your results BEFORE your provider.  I review lab and tests results each morning prior to seeing patients. Some results require collaboration with other providers to ensure you are receiving the most appropriate care. For this reason, we ask that you please allow a minimum of 3-5 business days from the time the ALL results have been received for your provider  to receive and review lab and test results and contact you about these.  Most lab and test result comments from the provider will be sent through MyChart. Your provider may recommend changes to the plan of care, follow-up visits, repeat testing, ask questions, or request an office visit to discuss these results. You may reply directly to this message or call the office at [phone]. to provide information for the provider or set up an appointment. In some instances, you will be called with test results and recommendations. Please let us  know if this is preferred and we will make note of this in your chart to provide this for you.    If you have not heard a response to your lab or test results in 5 business days from all results returning to MyChart, please call the office to let us  know. We ask that you please avoid calling prior to this time unless there is an emergent concern. Due to high call volumes, this can delay the resulting process.   After Hours: For all non-emergency after hours needs, please call the office at [phone]. and select the option to reach the on-call provider service. On-call services are shared between multiple [facility] offices and therefore it will not be possible to speak directly with your provider. On-call providers may provide medical advice and recommendations, but are unable to provide refills for maintenance medications.  For all emergency or urgent medical needs after normal business hours, we recommend that you seek care at the closest Urgent Care or Emergency Department to ensure appropriate treatment in a timely manner.  [facility] [place] has a 24 hour emergency room located on the ground floor for your convenience.    Urgent Concerns During the Business Day Providers are seeing patients from 8AM to 5PM, Monday through Thursday, and 8AM to 12PM on Friday with a busy schedule and are most often not able to respond to non-urgent calls until  the end of the day or the next business day. If you should have URGENT concerns during the day, please call and speak to the nurse or schedule a same day appointment so that we can address your concern without delay.    Thank you, again, for choosing me as your health care partner. I appreciate your trust and look forward to learning more about you.    Evalene Arts, FNP-C

## 2024-08-23 NOTE — Progress Notes (Unsigned)
 "  New Patient Office Visit  Subjective   Patient ID: Logan Howard, male    DOB: 1976/05/12  Age: 48 y.o. MRN: [id number]  CC:  Chief Complaint  Patient presents with   Establish Care   HPI Logan Howard is a 48 year old male who presents to establish with [facility].   CC: Patient here to establish care  Last PCP: [facility]  Specialists: PMHx: asthma/COPD, DVT, HTN   HTN: lisinopril  30mg  daily & hydrochlorothiazide  12.5mg  daily   Inhaler- 4-5x per week   Difficulty staying asleep-  Wakes up coughing  Smoking 2 packs per day, more coughing during the day       08/23/2024    3:22 PM  GAD 7 : Generalized Anxiety Score  Nervous, Anxious, on Edge 1  Control/stop worrying 1  Worry too much - different things 1  Trouble relaxing 1  Restless 1  Easily annoyed or irritable 1  Afraid - awful might happen 0  Total GAD 7 Score 6  Anxiety Difficulty Not difficult at all      08/23/2024    3:21 PM 04/14/2022   11:46 AM 08/16/2021   11:07 AM  PHQ9 SCORE ONLY  PHQ-9 Total Score 8 0  11      Data saved with a previous flowsheet row definition    Outpatient Encounter Medications as of 08/23/2024  Medication Sig   albuterol  (VENTOLIN  HFA) 108 (90 Base) MCG/ACT inhaler Inhale 2 puffs into the lungs every 4 (four) hours as needed for wheezing or shortness of breath.   fluticasone -salmeterol (ADVAIR DISKUS) 250-50 MCG/ACT AEPB Inhale 1 puff into the lungs in the morning and at bedtime.   hydrochlorothiazide  (HYDRODIURIL ) 12.5 MG tablet Take 1 tablet (12.5 mg total) by mouth daily.   lisinopril  (ZESTRIL ) 20 MG tablet Take 1.5 tabs (30mg ) by mouth daily.   No facility-administered encounter medications on file as of 08/23/2024.    Patient Active Problem List   Diagnosis Date Noted   Primary osteoarthritis of right knee 11/16/2014   Complex tear of medial meniscus of left knee as current injury 11/02/2014   Past Medical History:   Diagnosis Date   Asthma    DVT (deep venous thrombosis) (HCC)    Hypertension    Past Surgical History:  Procedure Laterality Date   NO PAST SURGERIES     Family History  Problem Relation Age of Onset   Diabetes Mother    Hypertension Mother    Arthritis Mother    Obesity Father    COPD Father    Arthritis Father    Cancer Father    Cancer Paternal Aunt    Cancer Paternal Uncle    Social History   Socioeconomic History   Marital status: Single    Spouse name: Not on file   Number of children: Not on file   Years of education: Not on file   Highest education level: Not on file  Occupational History   Not on file  Tobacco Use   Smoking status: Every Day    Current packs/day: 2.00    Average packs/day: 2.0 packs/day for 37.0 years (74.0 ttl pk-yrs)    Types: Cigarettes    Start date: 1989    Passive exposure: Never   Smokeless tobacco: Never   Tobacco comments:    2 packs daily  Vaping Use   Vaping status: Never Used  Substance and Sexual Activity   Alcohol use: Not Currently  Comment: 8 beers per day   Drug use: Not Currently   Sexual activity: Yes  Other Topics Concern   Not on file  Social History Narrative   Not on file   Social Drivers of Health   Tobacco Use: High Risk (08/23/2024)   Patient History    Smoking Tobacco Use: Every Day    Smokeless Tobacco Use: Never    Passive Exposure: Never  Financial Resource Strain: Not on file  Food Insecurity: Not on file  Transportation Needs: Not on file  Physical Activity: Not on file  Stress: Not on file  Social Connections: Not on file  Intimate Partner Violence: Not on file  Depression (PHQ2-9): Medium Risk (08/23/2024)   Depression (PHQ2-9)    PHQ-2 Score: 8  Alcohol Screen: Low Risk (04/14/2022)   Alcohol Screen    Last Alcohol Screening Score (AUDIT): 3  Housing: Not on file  Utilities: Not on file  Health Literacy: Not on file   Outpatient Medications Prior to Visit  Medication Sig  Dispense Refill   albuterol  (VENTOLIN  HFA) 108 (90 Base) MCG/ACT inhaler Inhale 2 puffs into the lungs every 4 (four) hours as needed for wheezing or shortness of breath. 18 g 0   hydrochlorothiazide  (HYDRODIURIL ) 12.5 MG tablet Take 1 tablet (12.5 mg total) by mouth daily. 30 tablet 1   lisinopril  (ZESTRIL ) 20 MG tablet Take 1.5 tabs (30mg ) by mouth daily. 45 tablet 1   No facility-administered medications prior to visit.   Allergies[1]  ROS: see HPI     Objective   Today's Vitals   08/23/24 1507  BP: 135/87  Pulse: 95  Resp: 16  SpO2: 98%  Weight: 280 lb (127 kg)  Height: 6' 4 (1.93 m)  PainSc: 0-No pain   Physical Exam     Assessment & Plan:   ***  Return in about 3 months (around 11/21/2024) for HTN & smoking cessation follow-up.   Evalene Arts, FNP     [1] No Known Allergies  "

## 2024-08-24 DIAGNOSIS — I10 Essential (primary) hypertension: Secondary | ICD-10-CM | POA: Insufficient documentation

## 2024-08-24 DIAGNOSIS — F5101 Primary insomnia: Secondary | ICD-10-CM | POA: Insufficient documentation

## 2024-08-24 DIAGNOSIS — J4489 Other specified chronic obstructive pulmonary disease: Secondary | ICD-10-CM | POA: Insufficient documentation

## 2024-11-15 ENCOUNTER — Ambulatory Visit: Admitting: Family Medicine
